# Patient Record
Sex: Female | Born: 1990 | Race: White | Hispanic: No | Marital: Married | Smoking: Never smoker
Health system: Southern US, Community
[De-identification: ages and names within clinical notes are randomized; demographics above are authoritative.]

## PROBLEM LIST (undated history)

## (undated) ENCOUNTER — Inpatient Hospital Stay (HOSPITAL_COMMUNITY): Payer: Self-pay

## (undated) DIAGNOSIS — J45909 Unspecified asthma, uncomplicated: Secondary | ICD-10-CM

## (undated) DIAGNOSIS — L732 Hidradenitis suppurativa: Secondary | ICD-10-CM

---

## 2009-09-02 HISTORY — PX: CHOLECYSTECTOMY: SHX55

## 2013-08-05 LAB — OB RESULTS CONSOLE ABO/RH: RH TYPE: POSITIVE

## 2013-08-05 LAB — OB RESULTS CONSOLE GC/CHLAMYDIA
Chlamydia: NEGATIVE
Gonorrhea: NEGATIVE

## 2013-08-05 LAB — OB RESULTS CONSOLE ANTIBODY SCREEN: ANTIBODY SCREEN: NEGATIVE

## 2013-08-05 LAB — OB RESULTS CONSOLE RPR: RPR: NONREACTIVE

## 2013-08-05 LAB — OB RESULTS CONSOLE HIV ANTIBODY (ROUTINE TESTING): HIV: NONREACTIVE

## 2013-08-05 LAB — OB RESULTS CONSOLE RUBELLA ANTIBODY, IGM: Rubella: IMMUNE

## 2013-08-05 LAB — OB RESULTS CONSOLE HEPATITIS B SURFACE ANTIGEN: Hepatitis B Surface Ag: NEGATIVE

## 2013-08-06 LAB — OB RESULTS CONSOLE ABO/RH: RH Type: POSITIVE

## 2013-08-06 LAB — OB RESULTS CONSOLE GC/CHLAMYDIA
CHLAMYDIA, DNA PROBE: NEGATIVE
GC PROBE AMP, GENITAL: NEGATIVE

## 2013-08-06 LAB — OB RESULTS CONSOLE HEPATITIS B SURFACE ANTIGEN: Hepatitis B Surface Ag: NEGATIVE

## 2013-08-06 LAB — OB RESULTS CONSOLE HIV ANTIBODY (ROUTINE TESTING): HIV: NONREACTIVE

## 2013-08-06 LAB — OB RESULTS CONSOLE RUBELLA ANTIBODY, IGM: RUBELLA: IMMUNE

## 2013-08-06 LAB — OB RESULTS CONSOLE ANTIBODY SCREEN: Antibody Screen: NEGATIVE

## 2013-08-06 LAB — OB RESULTS CONSOLE RPR: RPR: NONREACTIVE

## 2013-09-02 NOTE — L&D Delivery Note (Signed)
Patient was C/C/+1 and pushed for 7 minutes with epidural.   NSVD  female infant, Apgars 9,9, weight P.   The patient had one midline first degree laceration repaired with 3-0 vicryl R. Fundus was firm. EBL was expected. Placenta was delivered intact. Vagina was clear.  Baby was vigorous and doing skin to skin with mother.  Felicia [name]

## 2014-01-24 ENCOUNTER — Encounter (HOSPITAL_COMMUNITY): Payer: Self-pay | Admitting: *Deleted

## 2014-01-24 ENCOUNTER — Inpatient Hospital Stay (HOSPITAL_COMMUNITY)
Admission: AD | Admit: 2014-01-24 | Discharge: 2014-01-24 | Disposition: A | Discharge status: Home / Self Care | Payer: 59 | Source: Ambulatory Visit | Attending: Obstetrics and Gynecology | Admitting: Obstetrics and Gynecology

## 2014-01-24 DIAGNOSIS — O99891 Other specified diseases and conditions complicating pregnancy: Secondary | ICD-10-CM | POA: Insufficient documentation

## 2014-01-24 DIAGNOSIS — O26899 Other specified pregnancy related conditions, unspecified trimester: Secondary | ICD-10-CM

## 2014-01-24 DIAGNOSIS — N949 Unspecified condition associated with female genital organs and menstrual cycle: Secondary | ICD-10-CM | POA: Insufficient documentation

## 2014-01-24 DIAGNOSIS — Z87891 Personal history of nicotine dependence: Secondary | ICD-10-CM | POA: Insufficient documentation

## 2014-01-24 DIAGNOSIS — R102 Pelvic and perineal pain: Secondary | ICD-10-CM

## 2014-01-24 DIAGNOSIS — O9989 Other specified diseases and conditions complicating pregnancy, childbirth and the puerperium: Principal | ICD-10-CM

## 2014-01-24 DIAGNOSIS — O26893 Other specified pregnancy related conditions, third trimester: Secondary | ICD-10-CM

## 2014-01-24 DIAGNOSIS — N898 Other specified noninflammatory disorders of vagina: Secondary | ICD-10-CM | POA: Insufficient documentation

## 2014-01-24 LAB — URINE MICROSCOPIC-ADD ON

## 2014-01-24 LAB — URINALYSIS, ROUTINE W REFLEX MICROSCOPIC
Bilirubin Urine: NEGATIVE
Glucose, UA: NEGATIVE mg/dL
Hgb urine dipstick: NEGATIVE
Ketones, ur: NEGATIVE mg/dL
Nitrite: NEGATIVE
Protein, ur: NEGATIVE mg/dL
Specific Gravity, Urine: 1.01 (ref 1.005–1.030)
Urobilinogen, UA: 0.2 mg/dL (ref 0.0–1.0)
pH: 7 (ref 5.0–8.0)

## 2014-01-24 LAB — AMNISURE RUPTURE OF MEMBRANE (ROM) NOT AT ARMC: Amnisure ROM: NEGATIVE

## 2014-01-24 NOTE — MAU Provider Note (Signed)
History     CSN: [id number]  Arrival date and time: 01/24/14 1056   First Provider Initiated Contact with Patient 01/24/14 1209      Chief Complaint  Patient presents with  . Vaginal pain, leaking    HPI  [name] is a 23 y.o. female [name] at [redacted]w[redacted]d who presents with vaginal pain/ pressure and leaking of fluid. Patient states the pressure started 3 days ago and the leaking of fluid started this morning. The fluid was clear and non-odorous. Denies problems in this pregnancy; denies HTN or DM.  Denies bleeding, baby is moving well.  Last intercourse was < 24 hours ago.  OB History   Grav Para Term Preterm Abortions TAB SAB Ect Mult Living   2 1 1       1       No past medical history on file.  No past surgical history on file.  No family history on file.  History  Substance Use Topics  . Smoking status: Not on file  . Smokeless tobacco: Not on file  . Alcohol Use: Not on file    Allergies: No Known Allergies  Prescriptions prior to admission  Medication Sig Dispense Refill  . Prenatal Vit-Fe Fumarate-FA (PRENATAL MULTIVITAMIN) TABS tablet Take 1 tablet by mouth daily at 12 noon.       Results for orders placed during the hospital encounter of 01/24/14 (from the past 48 hour(s))  URINALYSIS, ROUTINE W REFLEX MICROSCOPIC     Status: Abnormal   Collection Time    01/24/14 11:25 AM      Result Value Ref Range   Color, Urine YELLOW  YELLOW   APPearance CLEAR  CLEAR   Specific Gravity, Urine 1.010  1.005 - 1.030   pH 7.0  5.0 - 8.0   Glucose, UA NEGATIVE  NEGATIVE mg/dL   Hgb urine dipstick NEGATIVE  NEGATIVE   Bilirubin Urine NEGATIVE  NEGATIVE   Ketones, ur NEGATIVE  NEGATIVE mg/dL   Protein, ur NEGATIVE  NEGATIVE mg/dL   Urobilinogen, UA 0.2  0.0 - 1.0 mg/dL   Nitrite NEGATIVE  NEGATIVE   Leukocytes, UA SMALL (*) NEGATIVE  URINE MICROSCOPIC-ADD ON     Status: Abnormal   Collection Time    01/24/14 11:25 AM      Result Value Ref Range   Squamous  Epithelial / LPF FEW (*) RARE   WBC, UA 3-6  <3 WBC/hpf   RBC / HPF 0-2  <3 RBC/hpf   Bacteria, UA RARE  RARE  AMNISURE RUPTURE OF MEMBRANE (ROM)     Status: None   Collection Time    01/24/14 12:20 PM      Result Value Ref Range   Amnisure ROM NEGATIVE      Review of Systems  Constitutional: Negative for fever and chills.  Gastrointestinal: Negative for nausea, vomiting, abdominal pain, diarrhea and constipation.  Genitourinary: Negative for dysuria, urgency, frequency and hematuria.       + vaginal discharge: clear leaking of fluid.  No vaginal bleeding. No dysuria.    Physical Exam   Temperature 98.3 F (36.8 C), height 5\' 8"  (1.727 m), weight 129.275 kg (285 lb).  Physical Exam  Constitutional: She is oriented to person, place, and time. She appears well-developed and well-nourished. No distress.  HENT:  Head: Normocephalic.  Eyes: Pupils are equal, round, and reactive to light.  Neck: Neck supple.  Respiratory: Effort normal.  GI: Soft. She exhibits no distension. There is no tenderness.  Genitourinary:  Speculum exam: Vagina - Small amount of creamy, mucus like discharge, no odor. No pooling of fluid  Cervix - No contact bleeding,no active bleeding  Bimanual exam: Cervix FT, posterior  Amnisure collected  Chaperone present for exam.   Musculoskeletal: Normal range of motion.  Neurological: She is alert and oriented to person, place, and time.  Skin: Skin is warm. She is not diaphoretic.  Psychiatric: Her behavior is normal.   Fetal Tracing: Baseline: 145 bpm  Variability: Moderate  Accelerations: 15x15 Decelerations: none Toco: No contractions  MAU Course  Procedures None  MDM UA Amnisure- negative  Discussed exam and amnisure results with Dr. Claiborne Billingsallahan; ok to discharge patient home.   Assessment and Plan   A:  1. Vaginal discharge in pregnancy in third trimester   2. Pelvic pressure in pregnancy    P:  Discharge home in stable  condition Keep your follow up appointment as scheduled Return to MAU if symptoms worsen  Pregnancy support belt recommended   Iona HansenJennifer Irene Rasch, NP  01/24/2014, 3:50 PM

## 2014-01-24 NOTE — Discharge Instructions (Signed)
Pelvic Pain, Female °Female pelvic pain can be caused by many different things and start from a variety of places. Pelvic pain refers to pain that is located in the lower half of the abdomen and between your hips. The pain may occur over a short period of time (acute) or may be reoccurring (chronic). The cause of pelvic pain may be related to disorders affecting the female reproductive organs (gynecologic), but it may also be related to the bladder, kidney stones, an intestinal complication, or muscle or skeletal problems. Getting help right away for pelvic pain is important, especially if there has been severe, sharp, or a sudden onset of unusual pain. It is also important to get help right away because some types of pelvic pain can be life threatening.  °CAUSES  °Below are only some of the causes of pelvic pain. The causes of pelvic pain can be in one of several categories.  °· Gynecologic. °· Pelvic inflammatory disease. °· Sexually transmitted infection. °· Ovarian cyst or a twisted ovarian ligament (ovarian torsion). °· Uterine lining that grows outside the uterus (endometriosis). °· Fibroids, cysts, or tumors. °· Ovulation. °· Pregnancy. °· Pregnancy that occurs outside the uterus (ectopic pregnancy). °· Miscarriage. °· Labor. °· Abruption of the placenta or ruptured uterus. °· Infection. °· Uterine infection (endometritis). °· Bladder infection. °· Diverticulitis. °· Miscarriage related to a uterine infection (septic abortion). °· Bladder. °· Inflammation of the bladder (cystitis). °· Kidney stone(s). °· Gastrointenstinal. °· Constipation. °· Diverticulitis. °· Neurologic. °· Trauma. °· Feeling pelvic pain because of mental or emotional causes (psychosomatic). °· Cancers of the bowel or pelvis. °EVALUATION  °Your caregiver will want to take a careful history of your concerns. This includes recent changes in your health, a careful gynecologic history of your periods (menses), and a sexual history. Obtaining  your family history and medical history is also important. Your caregiver may suggest a pelvic exam. A pelvic exam will help identify the location and severity of the pain. It also helps in the evaluation of which organ system may be involved. In order to identify the cause of the pelvic pain and be properly treated, your caregiver may order tests. These tests may include:  °· A pregnancy test. °· Pelvic ultrasonography. °· An X-ray exam of the abdomen. °· A urinalysis or evaluation of vaginal discharge. °· Blood tests. °HOME CARE INSTRUCTIONS  °· Only take over-the-counter or prescription medicines for pain, discomfort, or fever as directed by your caregiver.   °· Rest as directed by your caregiver.   °· Eat a balanced diet.   °· Drink enough fluids to make your urine clear or pale yellow, or as directed.   °· Avoid sexual intercourse if it causes pain.   °· Apply warm or cold compresses to the lower abdomen depending on which one helps the pain.   °· Avoid stressful situations.   °· Keep a journal of your pelvic pain. Write down when it started, where the pain is located, and if there are things that seem to be associated with the pain, such as food or your menstrual cycle. °· Follow up with your caregiver as directed.   °SEEK MEDICAL CARE IF: °· Your medicine does not help your pain. °· You have abnormal vaginal discharge. °SEEK IMMEDIATE MEDICAL CARE IF:  °· You have heavy bleeding from the vagina.   °· Your pelvic pain increases.   °· You feel lightheaded or faint.   °· You have chills.   °· You have pain with urination or blood in your urine.   °· You have uncontrolled   diarrhea or vomiting.   °· You have a fever or persistent symptoms for more than 3 days. °· You have a fever and your symptoms suddenly get worse.   °· You are being physically or sexually abused.   °MAKE SURE YOU: °· Understand these instructions. °· Will watch your condition. °· Will get help if you are not doing well or get worse. °Document  Released: 07/16/2004 Document Revised: 02/18/2012 Document Reviewed: 12/09/2011 °[employer]® Patient Information ©2014 [employer]. ° °

## 2014-02-23 LAB — OB RESULTS CONSOLE GBS: GBS: NEGATIVE

## 2014-03-22 ENCOUNTER — Encounter (HOSPITAL_COMMUNITY): Payer: Self-pay | Admitting: *Deleted

## 2014-03-22 ENCOUNTER — Telehealth (HOSPITAL_COMMUNITY): Payer: Self-pay | Admitting: *Deleted

## 2014-03-22 NOTE — Telephone Encounter (Signed)
Preadmission screen  

## 2014-03-24 ENCOUNTER — Encounter (HOSPITAL_COMMUNITY): Payer: 59 | Admitting: Anesthesiology

## 2014-03-24 ENCOUNTER — Inpatient Hospital Stay (HOSPITAL_COMMUNITY): Payer: 59 | Admitting: Anesthesiology

## 2014-03-24 ENCOUNTER — Inpatient Hospital Stay (HOSPITAL_COMMUNITY)
Admission: RE | Admit: 2014-03-24 | Discharge: 2014-03-26 | DRG: 775 | Disposition: A | Discharge status: Home / Self Care | Payer: 59 | Source: Ambulatory Visit | Attending: Obstetrics and Gynecology | Admitting: Obstetrics and Gynecology

## 2014-03-24 ENCOUNTER — Encounter (HOSPITAL_COMMUNITY): Payer: Self-pay

## 2014-03-24 DIAGNOSIS — Z3490 Encounter for supervision of normal pregnancy, unspecified, unspecified trimester: Secondary | ICD-10-CM

## 2014-03-24 DIAGNOSIS — Z6841 Body Mass Index (BMI) 40.0 and over, adult: Secondary | ICD-10-CM

## 2014-03-24 DIAGNOSIS — E669 Obesity, unspecified: Secondary | ICD-10-CM | POA: Diagnosis present

## 2014-03-24 DIAGNOSIS — O99214 Obesity complicating childbirth: Secondary | ICD-10-CM

## 2014-03-24 DIAGNOSIS — Z9089 Acquired absence of other organs: Secondary | ICD-10-CM

## 2014-03-24 LAB — CBC
HEMATOCRIT: 33.4 % — AB (ref 36.0–46.0)
HEMOGLOBIN: 11.2 g/dL — AB (ref 12.0–15.0)
MCH: 29.4 pg (ref 26.0–34.0)
MCHC: 33.5 g/dL (ref 30.0–36.0)
MCV: 87.7 fL (ref 78.0–100.0)
Platelets: 196 10*3/uL (ref 150–400)
RBC: 3.81 MIL/uL — AB (ref 3.87–5.11)
RDW: 13.9 % (ref 11.5–15.5)
WBC: 12.8 10*3/uL — AB (ref 4.0–10.5)

## 2014-03-24 LAB — TYPE AND SCREEN
ABO/RH(D): A POS
Antibody Screen: NEGATIVE

## 2014-03-24 LAB — ABO/RH: ABO/RH(D): A POS

## 2014-03-24 LAB — SAMPLE TO BLOOD BANK

## 2014-03-24 LAB — RPR

## 2014-03-24 MED ORDER — PHENYLEPHRINE 40 MCG/ML (10ML) SYRINGE FOR IV PUSH (FOR BLOOD PRESSURE SUPPORT)
80.0000 ug | PREFILLED_SYRINGE | INTRAVENOUS | Status: DC | PRN
Start: 2014-03-24 — End: 2014-03-24
  Filled 2014-03-24: qty 2

## 2014-03-24 MED ORDER — LANOLIN HYDROUS EX OINT: TOPICAL_OINTMENT | CUTANEOUS | Status: DC | PRN

## 2014-03-24 MED ORDER — METHYLERGONOVINE MALEATE 0.2 MG/ML IJ SOLN: 0.2000 mg | INTRAMUSCULAR | Status: DC | PRN

## 2014-03-24 MED ORDER — LACTATED RINGERS IV SOLN: 500.0000 mL | INTRAVENOUS | Status: DC | PRN

## 2014-03-24 MED ORDER — OXYTOCIN 40 UNITS IN LACTATED RINGERS INFUSION - SIMPLE MED
1.0000 m[IU]/min | INTRAVENOUS | Status: DC
  Administered 2014-03-24: 2 m[IU]/min via INTRAVENOUS

## 2014-03-24 MED ORDER — IBUPROFEN 800 MG PO TABS
800.0000 mg | ORAL_TABLET | Freq: Three times a day (TID) | ORAL | Status: DC
  Administered 2014-03-24 – 2014-03-26 (×4): 800 mg via ORAL
  Filled 2014-03-24 (×4): qty 1

## 2014-03-24 MED ORDER — FERROUS SULFATE 325 (65 FE) MG PO TABS
325.0000 mg | ORAL_TABLET | Freq: Two times a day (BID) | ORAL | Status: DC
  Administered 2014-03-25 – 2014-03-26 (×3): 325 mg via ORAL
  Filled 2014-03-24 (×3): qty 1

## 2014-03-24 MED ORDER — ONDANSETRON HCL 4 MG/2ML IJ SOLN: 4.0000 mg | INTRAMUSCULAR | Status: DC | PRN

## 2014-03-24 MED ORDER — DIBUCAINE 1 % RE OINT: 1.0000 "application " | TOPICAL_OINTMENT | RECTAL | Status: DC | PRN

## 2014-03-24 MED ORDER — FENTANYL 2.5 MCG/ML BUPIVACAINE 1/10 % EPIDURAL INFUSION (WH - ANES)
14.0000 mL/h | INTRAMUSCULAR | Status: DC | PRN
  Administered 2014-03-24 (×2): 14 mL/h via EPIDURAL

## 2014-03-24 MED ORDER — DIPHENHYDRAMINE HCL 25 MG PO CAPS: 25.0000 mg | ORAL_CAPSULE | Freq: Four times a day (QID) | ORAL | Status: DC | PRN

## 2014-03-24 MED ORDER — OXYCODONE-ACETAMINOPHEN 5-325 MG PO TABS: 1.0000 | ORAL_TABLET | ORAL | Status: DC | PRN

## 2014-03-24 MED ORDER — ONDANSETRON HCL 4 MG PO TABS: 4.0000 mg | ORAL_TABLET | ORAL | Status: DC | PRN

## 2014-03-24 MED ORDER — LACTATED RINGERS IV SOLN
500.0000 mL | Freq: Once | INTRAVENOUS | Status: AC
  Administered 2014-03-24: 500 mL via INTRAVENOUS

## 2014-03-24 MED ORDER — OXYTOCIN BOLUS FROM INFUSION
500.0000 mL | INTRAVENOUS | Status: DC
  Administered 2014-03-24: 500 mL via INTRAVENOUS

## 2014-03-24 MED ORDER — DIPHENHYDRAMINE HCL 50 MG/ML IJ SOLN: 12.5000 mg | INTRAMUSCULAR | Status: DC | PRN

## 2014-03-24 MED ORDER — LIDOCAINE HCL (PF) 1 % IJ SOLN
INTRAMUSCULAR | Status: DC | PRN
  Administered 2014-03-24: 10 mL

## 2014-03-24 MED ORDER — TETANUS-DIPHTH-ACELL PERTUSSIS 5-2.5-18.5 LF-MCG/0.5 IM SUSP: 0.5000 mL | Freq: Once | INTRAMUSCULAR | Status: DC

## 2014-03-24 MED ORDER — PHENYLEPHRINE 40 MCG/ML (10ML) SYRINGE FOR IV PUSH (FOR BLOOD PRESSURE SUPPORT)
80.0000 ug | PREFILLED_SYRINGE | INTRAVENOUS | Status: DC | PRN
  Filled 2014-03-24: qty 2
  Filled 2014-03-24: qty 10

## 2014-03-24 MED ORDER — IBUPROFEN 600 MG PO TABS
600.0000 mg | ORAL_TABLET | Freq: Four times a day (QID) | ORAL | Status: DC | PRN
  Administered 2014-03-24: 600 mg via ORAL
  Filled 2014-03-24: qty 1

## 2014-03-24 MED ORDER — MEASLES, MUMPS & RUBELLA VAC ~~LOC~~ INJ: 0.5000 mL | INJECTION | Freq: Once | SUBCUTANEOUS | Status: DC

## 2014-03-24 MED ORDER — OXYTOCIN 10 UNIT/ML IJ SOLN
40.0000 [IU] | INTRAVENOUS | Status: DC
  Filled 2014-03-24: qty 4

## 2014-03-24 MED ORDER — TERBUTALINE SULFATE 1 MG/ML IJ SOLN: 0.2500 mg | Freq: Once | INTRAMUSCULAR | Status: DC | PRN

## 2014-03-24 MED ORDER — ZOLPIDEM TARTRATE 5 MG PO TABS: 5.0000 mg | ORAL_TABLET | Freq: Every evening | ORAL | Status: DC | PRN

## 2014-03-24 MED ORDER — CITRIC ACID-SODIUM CITRATE 334-500 MG/5ML PO SOLN: 30.0000 mL | ORAL | Status: DC | PRN

## 2014-03-24 MED ORDER — SENNOSIDES-DOCUSATE SODIUM 8.6-50 MG PO TABS
2.0000 | ORAL_TABLET | ORAL | Status: DC
  Administered 2014-03-24 – 2014-03-25 (×2): 2 via ORAL
  Filled 2014-03-24 (×2): qty 2

## 2014-03-24 MED ORDER — BENZOCAINE-MENTHOL 20-0.5 % EX AERO: 1.0000 "application " | INHALATION_SPRAY | CUTANEOUS | Status: DC | PRN

## 2014-03-24 MED ORDER — LIDOCAINE HCL (PF) 1 % IJ SOLN
30.0000 mL | INTRAMUSCULAR | Status: DC | PRN
  Filled 2014-03-24: qty 30

## 2014-03-24 MED ORDER — FLEET ENEMA 7-19 GM/118ML RE ENEM: 1.0000 | ENEMA | RECTAL | Status: DC | PRN

## 2014-03-24 MED ORDER — SODIUM CHLORIDE 0.9 % IJ SOLN: 3.0000 mL | INTRAMUSCULAR | Status: DC | PRN

## 2014-03-24 MED ORDER — EPHEDRINE 5 MG/ML INJ
10.0000 mg | INTRAVENOUS | Status: DC | PRN
  Filled 2014-03-24: qty 2

## 2014-03-24 MED ORDER — MAGNESIUM HYDROXIDE 400 MG/5ML PO SUSP: 30.0000 mL | ORAL | Status: DC | PRN

## 2014-03-24 MED ORDER — OXYTOCIN 40 UNITS IN LACTATED RINGERS INFUSION - SIMPLE MED
62.5000 mL/h | INTRAVENOUS | Status: DC
  Administered 2014-03-24: 62.5 mL/h via INTRAVENOUS
  Filled 2014-03-24 (×2): qty 1000

## 2014-03-24 MED ORDER — PRENATAL MULTIVITAMIN CH
1.0000 | ORAL_TABLET | Freq: Every day | ORAL | Status: DC
  Administered 2014-03-25: 1 via ORAL
  Filled 2014-03-24: qty 1

## 2014-03-24 MED ORDER — ACETAMINOPHEN 325 MG PO TABS: 650.0000 mg | ORAL_TABLET | ORAL | Status: DC | PRN

## 2014-03-24 MED ORDER — LACTATED RINGERS IV SOLN
INTRAVENOUS | Status: DC
  Administered 2014-03-24 (×2): via INTRAVENOUS

## 2014-03-24 MED ORDER — EPHEDRINE 5 MG/ML INJ
10.0000 mg | INTRAVENOUS | Status: DC | PRN
  Filled 2014-03-24: qty 4
  Filled 2014-03-24: qty 2

## 2014-03-24 MED ORDER — METHYLERGONOVINE MALEATE 0.2 MG PO TABS
0.2000 mg | ORAL_TABLET | ORAL | Status: DC | PRN
  Administered 2014-03-24 (×2): 0.2 mg via ORAL
  Filled 2014-03-24 (×2): qty 1

## 2014-03-24 MED ORDER — ONDANSETRON HCL 4 MG/2ML IJ SOLN: 4.0000 mg | Freq: Four times a day (QID) | INTRAMUSCULAR | Status: DC | PRN

## 2014-03-24 MED ORDER — WITCH HAZEL-GLYCERIN EX PADS: 1.0000 "application " | MEDICATED_PAD | CUTANEOUS | Status: DC | PRN

## 2014-03-24 MED ORDER — SODIUM CHLORIDE 0.9 % IV SOLN: 250.0000 mL | INTRAVENOUS | Status: DC | PRN

## 2014-03-24 MED ORDER — SIMETHICONE 80 MG PO CHEW: 80.0000 mg | CHEWABLE_TABLET | ORAL | Status: DC | PRN

## 2014-03-24 MED ORDER — SODIUM CHLORIDE 0.9 % IJ SOLN: 3.0000 mL | Freq: Two times a day (BID) | INTRAMUSCULAR | Status: DC

## 2014-03-24 MED ORDER — FENTANYL 2.5 MCG/ML BUPIVACAINE 1/10 % EPIDURAL INFUSION (WH - ANES)
14.0000 mL/h | INTRAMUSCULAR | Status: DC | PRN
  Filled 2014-03-24 (×2): qty 125

## 2014-03-24 NOTE — Anesthesia Procedure Notes (Signed)
Epidural Patient location during procedure: OB  Preanesthetic Checklist Completed: patient identified, site marked, surgical consent, pre-op evaluation, timeout performed, IV checked, risks and benefits discussed and monitors and equipment checked  Epidural Patient position: sitting Prep: site prepped and draped and DuraPrep Patient monitoring: continuous pulse ox and blood pressure Approach: midline Injection technique: LOR air  Needle:  Needle type: Tuohy  Needle gauge: 17 G Needle length: 9 cm and 9 Needle insertion depth: 7 cm Catheter type: closed end flexible Catheter size: 19 Gauge Catheter at skin depth: 14 cm Test dose: negative  Assessment Events: blood not aspirated, injection not painful, no injection resistance, negative IV test and no paresthesia  Additional Notes Dosing of Epidural:  1st dose, through catheter .............................................  Xylocaine 40 mg  2nd dose, through catheter, after waiting 3 minutes.........Xylocaine 60 mg    ( 1% Xylo charted as a single dose in Epic Meds for ease of charting; actual dosing was fractionated as above, for saftey's sake)  As each dose occurred, patient was free of IV sx; and patient exhibited no evidence of SA injection.  Patient is more comfortable after epidural dosed. Please see RN's note for documentation of vital signs,and FHR which are stable.  Patient reminded not to try to ambulate with numb legs, and that an RN must be present when she attempts to get up.       

## 2014-03-24 NOTE — Progress Notes (Signed)
23 y.o. 4772w3d  [name] comes in for elective induction at term.  Otherwise has good fetal movement and no bleeding.  No past medical history on file.  Past Surgical History  Procedure Laterality Date  . Cholecystectomy  2011    OB History  Gravida Para Term Preterm AB SAB TAB Ectopic Multiple Living  2 1 1       1     # Outcome Date GA Lbr Len/2nd Weight Sex Delivery Anes PTL Lv  2 CUR           1 TRM 2013 4484w0d 03:00 4.082 kg (9 lb)  SVD EPI N Y      History   Social History  . Marital Status: Married    Spouse Name: N/A    Number of Children: N/A  . Years of Education: N/A   Occupational History  . Not on file.   Social History Main Topics  . Smoking status: Not on file  . Smokeless tobacco: Not on file  . Alcohol Use: No  . Drug Use: Not on file  . Sexual Activity: Yes    Birth Control/ Protection: None   Other Topics Concern  . Not on file   Social History Narrative  . No narrative on file   Review of patient's allergies indicates no known allergies.    Prenatal Transfer Tool  Maternal Diabetes: No Genetic Screening: Declined Maternal Ultrasounds/Referrals: Normal Fetal Ultrasounds or other Referrals:  None Maternal Substance Abuse:  No Significant Maternal Medications:  None Significant Maternal Lab Results: None  Other PNC: uncomplicated.    Filed Vitals:   03/24/14 0740  BP: 125/68  Pulse: 100  Temp: 97.7 F (36.5 C)  Resp: 20     Lungs/Cor:  NAD Abdomen:  soft, gravid Ex:  no cords, erythema SVE:  3/30/-2, AROM clear FHTs:  130, good STV, NST R Toco:  q occ   A/P   Term elective induction.  GBS NEG.  Aragorn Recker A

## 2014-03-24 NOTE — Anesthesia Preprocedure Evaluation (Addendum)
Anesthesia Evaluation  Patient identified by MRN, date of birth, ID band Patient awake    Reviewed: Allergy & Precautions, H&P , Patient's Chart, lab work & pertinent test results  Airway Mallampati: III  TM Distance: >3 FB Neck ROM: full    Dental   Pulmonary  breath sounds clear to auscultation        Cardiovascular Rhythm:regular Rate:Normal     Neuro/Psych    GI/Hepatic   Endo/Other  Morbid obesity  Renal/GU      Musculoskeletal   Abdominal   Peds  Hematology   Anesthesia Other Findings   Reproductive/Obstetrics (+) Pregnancy                             Anesthesia Physical Anesthesia Plan  ASA: III  Anesthesia Plan: Epidural   Post-op Pain Management:    Induction:   Airway Management Planned:   Additional Equipment:   Intra-op Plan:   Post-operative Plan:   Informed Consent: I have reviewed the patients History and Physical, chart, labs and discussed the procedure including the risks, benefits and alternatives for the proposed anesthesia with the patient or authorized representative who has indicated his/her understanding and acceptance.     Plan Discussed with:   Anesthesia Plan Comments:         Anesthesia Quick Evaluation  

## 2014-03-25 ENCOUNTER — Encounter (HOSPITAL_COMMUNITY): Payer: Self-pay

## 2014-03-25 LAB — CBC
HCT: 34.7 % — ABNORMAL LOW (ref 36.0–46.0)
HEMOGLOBIN: 11.3 g/dL — AB (ref 12.0–15.0)
MCH: 28.7 pg (ref 26.0–34.0)
MCHC: 32.6 g/dL (ref 30.0–36.0)
MCV: 88.1 fL (ref 78.0–100.0)
Platelets: 187 10*3/uL (ref 150–400)
RBC: 3.94 MIL/uL (ref 3.87–5.11)
RDW: 13.8 % (ref 11.5–15.5)
WBC: 13.6 10*3/uL — ABNORMAL HIGH (ref 4.0–10.5)

## 2014-03-25 NOTE — Anesthesia Postprocedure Evaluation (Signed)
Anesthesia Post Note  Patient: Hospital doctorAmber L [name]  Procedure(s) Performed: * No procedures listed *  Anesthesia type: Epidural  Patient location: Mother/Baby  Post pain: Pain level controlled  Post assessment: Post-op Vital signs reviewed  Last Vitals:  Filed Vitals:   03/25/14 0604  BP: 105/62  Pulse: 75  Temp: 36.6 C  Resp: 18    Post vital signs: Reviewed  Level of consciousness:alert  Complications: No apparent anesthesia complications

## 2014-03-25 NOTE — Progress Notes (Signed)
Patient is eating, ambulating, voiding.  Pain control is good.  Appropriate lochia.  No complaints.  Filed Vitals:   03/24/14 1832 03/24/14 2201 03/25/14 0604 03/25/14 1810  BP: 127/77 117/71 105/62 126/65  Pulse: 98 93 75 88  Temp: 98.5 F (36.9 C) 98 F (36.7 C) 97.8 F (36.6 C) 97.9 F (36.6 C)  TempSrc: Oral Oral Oral Oral  Resp: 18 18 18 18   Height:      Weight:      SpO2: 98% 99% 97%     Fundus firm Perineum without swelling.  Lab Results  Component Value Date   WBC 13.6* 03/25/2014   HGB 11.3* 03/25/2014   HCT 34.7* 03/25/2014   MCV 88.1 03/25/2014   PLT 187 03/25/2014    --/--/A POS, A POS (07/23 0740)  A/P Post partum day 1.  Routine care.   Circ in office. Expect d/c 7/25.    Philip AspenALLAHAN, Daine Gunther

## 2014-03-26 NOTE — Discharge Summary (Signed)
Obstetric Discharge Summary Reason for Admission: induction of labor Prenatal Procedures: ultrasound Intrapartum Procedures: spontaneous vaginal delivery Postpartum Procedures: vaginal pack for continued bleeding at repair, removed and stable Complications-Operative and Postpartum: 1st degree perineal laceration Hemoglobin  Date Value Ref Range Status  03/25/2014 11.3* 12.0 - 15.0 g/dL Final     HCT  Date Value Ref Range Status  03/25/2014 34.7* 36.0 - 46.0 % Final    Physical Exam:  General: alert and cooperative Lochia: appropriate Uterine Fundus: firm DVT Evaluation: No evidence of DVT seen on physical exam.  Discharge Diagnoses: Term Pregnancy-delivered  Discharge Information: Date: 03/26/2014 Activity: pelvic rest Diet: routine Medications: PNV and Ibuprofen Condition: stable Instructions: refer to practice specific booklet Discharge to: home Follow-up Information   Follow up with HORVATH,MICHELLE A, MD In 4 weeks.   Specialty:  Obstetrics and Gynecology   Contact information:   [address] [place] [id number] [phone]       Newborn Data: Live born female  Birth Weight: 9 lb 3 oz (4167 g) APGAR: 9, 9  Home with mother.  Philip AspenCALLAHAN, Abdoul Encinas 03/26/2014, 10:03 AM

## 2014-03-29 NOTE — H&P (Signed)
23 y.o. 3954w3d  [name] comes in for elective induction at term.  Otherwise has good fetal movement and no bleeding.  History reviewed. No pertinent past medical history.  Past Surgical History  Procedure Laterality Date  . Cholecystectomy  2011    OB History  Gravida Para Term Preterm AB SAB TAB Ectopic Multiple Living  2 2 2       2     # Outcome Date GA Lbr Len/2nd Weight Sex Delivery Anes PTL Lv  2 TRM 03/24/14 9854w3d 06:37 / 00:20 4.167 kg (9 lb 3 oz) M SVD EPI  Y  1 TRM 2013 3467w0d 03:00 4.082 kg (9 lb)  SVD EPI N Y      History   Social History  . Marital Status: Married    Spouse Name: Felicia [name]    Number of Children: Felicia [name]  . Years of Education: Felicia [name]   Occupational History  . Not on file.   Social History Main Topics  . Smoking status: Never Smoker   . Smokeless tobacco: Not on file  . Alcohol Use: No  . Drug Use: Not on file  . Sexual Activity: Yes    Birth Control/ Protection: None   Other Topics Concern  . Not on file   Social History Narrative  . No narrative on file   Review of patient's allergies indicates no known allergies.    Prenatal Transfer Tool  Maternal Diabetes: No Genetic Screening: Declined Maternal Ultrasounds/Referrals: Normal Fetal Ultrasounds or other Referrals:  None Maternal Substance Abuse:  No Significant Maternal Medications:  None Significant Maternal Lab Results: None  Other PNC: uncomplicated.    Filed Vitals:   03/26/14 0556  BP: 104/53  Pulse: 71  Temp: 98 F (36.7 C)  Resp: 18     Lungs/Cor:  NAD Abdomen:  soft, gravid Ex:  no cords, erythema SVE:  3/30/-2, AROM clear FHTs:  130, good STV, NST R Toco:  q occ   [name]/P   Term elective induction.  GBS NEG.  Felicia [name]

## 2014-07-04 ENCOUNTER — Encounter (HOSPITAL_COMMUNITY): Payer: Self-pay

## 2015-02-20 LAB — OB RESULTS CONSOLE GBS: STREP GROUP B AG: NEGATIVE

## 2015-03-03 ENCOUNTER — Other Ambulatory Visit: Payer: Self-pay | Admitting: Obstetrics and Gynecology

## 2015-03-03 ENCOUNTER — Encounter (HOSPITAL_COMMUNITY): Payer: Self-pay | Admitting: *Deleted

## 2015-03-04 NOTE — Anesthesia Preprocedure Evaluation (Addendum)
Anesthesia Evaluation  Patient identified by MRN, date of birth, ID band Patient awake    Reviewed: Allergy & Precautions, NPO status , Patient's Chart, lab work & pertinent test results, reviewed documented beta blocker date and time   Airway Mallampati: II   Neck ROM: Full    Dental  (+) Teeth Intact, Dental Advisory Given   Pulmonary neg pulmonary ROS,  breath sounds clear to auscultation        Cardiovascular negative cardio ROS  Rhythm:Regular     Neuro/Psych negative neurological ROS     GI/Hepatic negative GI ROS, Neg liver ROS,   Endo/Other  Morbid obesity  Renal/GU negative Renal ROS     Musculoskeletal   Abdominal (+)  Abdomen: soft.    Peds  Hematology   Anesthesia Other Findings   Reproductive/Obstetrics                            Anesthesia Physical Anesthesia Plan  ASA: II  Anesthesia Plan: MAC and General   Post-op Pain Management:    Induction: Intravenous  Airway Management Planned: LMA  Additional Equipment:   Intra-op Plan:   Post-operative Plan:   Informed Consent: I have reviewed the patients History and Physical, chart, labs and discussed the procedure including the risks, benefits and alternatives for the proposed anesthesia with the patient or authorized representative who has indicated his/her understanding and acceptance.     Plan Discussed with:   Anesthesia Plan Comments:        Anesthesia Quick Evaluation

## 2015-03-07 ENCOUNTER — Encounter (HOSPITAL_COMMUNITY)
Admission: RE | Disposition: A | Discharge status: Home / Self Care | Payer: Self-pay | Source: Ambulatory Visit | Attending: Obstetrics and Gynecology

## 2015-03-07 ENCOUNTER — Encounter (HOSPITAL_COMMUNITY): Payer: Self-pay

## 2015-03-07 ENCOUNTER — Ambulatory Visit (HOSPITAL_COMMUNITY): Payer: 59

## 2015-03-07 ENCOUNTER — Ambulatory Visit (HOSPITAL_COMMUNITY): Payer: 59 | Admitting: Anesthesiology

## 2015-03-07 ENCOUNTER — Ambulatory Visit (HOSPITAL_COMMUNITY)
Admission: RE | Admit: 2015-03-07 | Discharge: 2015-03-07 | Disposition: A | Discharge status: Home / Self Care | Payer: 59 | Source: Ambulatory Visit | Attending: Obstetrics and Gynecology | Admitting: Obstetrics and Gynecology

## 2015-03-07 DIAGNOSIS — O99211 Obesity complicating pregnancy, first trimester: Secondary | ICD-10-CM | POA: Diagnosis not present

## 2015-03-07 DIAGNOSIS — Z3A13 13 weeks gestation of pregnancy: Secondary | ICD-10-CM | POA: Diagnosis not present

## 2015-03-07 DIAGNOSIS — Z3689 Encounter for other specified antenatal screening: Secondary | ICD-10-CM

## 2015-03-07 DIAGNOSIS — O021 Missed abortion: Secondary | ICD-10-CM | POA: Insufficient documentation

## 2015-03-07 DIAGNOSIS — Z9049 Acquired absence of other specified parts of digestive tract: Secondary | ICD-10-CM | POA: Diagnosis not present

## 2015-03-07 DIAGNOSIS — Z6841 Body Mass Index (BMI) 40.0 and over, adult: Secondary | ICD-10-CM | POA: Diagnosis not present

## 2015-03-07 HISTORY — PX: DILATION AND EVACUATION: SHX1459

## 2015-03-07 LAB — TYPE AND SCREEN
ABO/RH(D): A POS
Antibody Screen: NEGATIVE

## 2015-03-07 LAB — CBC
HEMATOCRIT: 36.1 % (ref 36.0–46.0)
HEMOGLOBIN: 12.4 g/dL (ref 12.0–15.0)
MCH: 29.2 pg (ref 26.0–34.0)
MCHC: 34.3 g/dL (ref 30.0–36.0)
MCV: 84.9 fL (ref 78.0–100.0)
Platelets: 232 10*3/uL (ref 150–400)
RBC: 4.25 MIL/uL (ref 3.87–5.11)
RDW: 14.3 % (ref 11.5–15.5)
WBC: 8.6 10*3/uL (ref 4.0–10.5)

## 2015-03-07 SURGERY — DILATION AND EVACUATION, UTERUS
Anesthesia: Monitor Anesthesia Care | Site: Uterus

## 2015-03-07 MED ORDER — PROPOFOL 500 MG/50ML IV EMUL
INTRAVENOUS | Status: DC | PRN
  Administered 2015-03-07: 30 mg via INTRAVENOUS
  Administered 2015-03-07: 50 mg via INTRAVENOUS
  Administered 2015-03-07 (×2): 30 mg via INTRAVENOUS
  Administered 2015-03-07: 200 mg via INTRAVENOUS
  Administered 2015-03-07 (×2): 30 mg via INTRAVENOUS

## 2015-03-07 MED ORDER — SUCCINYLCHOLINE CHLORIDE 20 MG/ML IJ SOLN
INTRAMUSCULAR | Status: AC
  Filled 2015-03-07: qty 1

## 2015-03-07 MED ORDER — PROPOFOL 10 MG/ML IV BOLUS
INTRAVENOUS | Status: AC
  Filled 2015-03-07: qty 20

## 2015-03-07 MED ORDER — MIDAZOLAM HCL 2 MG/2ML IJ SOLN
INTRAMUSCULAR | Status: DC | PRN
  Administered 2015-03-07: 2 mg via INTRAVENOUS

## 2015-03-07 MED ORDER — PROPOFOL 10 MG/ML IV BOLUS
INTRAVENOUS | Status: AC
  Filled 2015-03-07: qty 40

## 2015-03-07 MED ORDER — DEXAMETHASONE SODIUM PHOSPHATE 4 MG/ML IJ SOLN
INTRAMUSCULAR | Status: AC
  Filled 2015-03-07: qty 1

## 2015-03-07 MED ORDER — KETOROLAC TROMETHAMINE 30 MG/ML IJ SOLN
INTRAMUSCULAR | Status: AC
  Filled 2015-03-07: qty 1

## 2015-03-07 MED ORDER — ONDANSETRON HCL 4 MG/2ML IJ SOLN
INTRAMUSCULAR | Status: AC
  Filled 2015-03-07: qty 2

## 2015-03-07 MED ORDER — MIDAZOLAM HCL 2 MG/2ML IJ SOLN
INTRAMUSCULAR | Status: AC
  Filled 2015-03-07: qty 2

## 2015-03-07 MED ORDER — FENTANYL CITRATE (PF) 100 MCG/2ML IJ SOLN
INTRAMUSCULAR | Status: AC
  Filled 2015-03-07: qty 2

## 2015-03-07 MED ORDER — SCOPOLAMINE 1 MG/3DAYS TD PT72
1.0000 | MEDICATED_PATCH | Freq: Once | TRANSDERMAL | Status: DC
  Administered 2015-03-07: 1.5 mg via TRANSDERMAL

## 2015-03-07 MED ORDER — LIDOCAINE HCL 1 % IJ SOLN
INTRAMUSCULAR | Status: DC | PRN
  Administered 2015-03-07: 20 mL

## 2015-03-07 MED ORDER — ONDANSETRON HCL 4 MG/2ML IJ SOLN
INTRAMUSCULAR | Status: DC | PRN
  Administered 2015-03-07: 4 mg via INTRAVENOUS

## 2015-03-07 MED ORDER — PROPOFOL INFUSION 10 MG/ML OPTIME: INTRAVENOUS | Status: DC | PRN

## 2015-03-07 MED ORDER — SCOPOLAMINE 1 MG/3DAYS TD PT72
MEDICATED_PATCH | TRANSDERMAL | Status: AC
  Filled 2015-03-07: qty 1

## 2015-03-07 MED ORDER — FENTANYL CITRATE (PF) 100 MCG/2ML IJ SOLN
INTRAMUSCULAR | Status: DC | PRN
  Administered 2015-03-07 (×2): 50 ug via INTRAVENOUS

## 2015-03-07 MED ORDER — LIDOCAINE HCL (CARDIAC) 20 MG/ML IV SOLN
INTRAVENOUS | Status: DC | PRN
  Administered 2015-03-07: 80 mg via INTRAVENOUS
  Administered 2015-03-07: 20 mg via INTRAVENOUS
  Administered 2015-03-07: 2 mg via INTRAVENOUS

## 2015-03-07 MED ORDER — DEXAMETHASONE SODIUM PHOSPHATE 10 MG/ML IJ SOLN
INTRAMUSCULAR | Status: DC | PRN
  Administered 2015-03-07: 4 mg via INTRAVENOUS

## 2015-03-07 MED ORDER — PROPOFOL INFUSION 10 MG/ML OPTIME
INTRAVENOUS | Status: DC | PRN
  Administered 2015-03-07: 120 ug/kg/min via INTRAVENOUS

## 2015-03-07 MED ORDER — CEFAZOLIN SODIUM-DEXTROSE 2-3 GM-% IV SOLR
INTRAVENOUS | Status: AC
  Administered 2015-03-07: 2 g via INTRAVENOUS
  Filled 2015-03-07: qty 50

## 2015-03-07 MED ORDER — LIDOCAINE HCL (CARDIAC) 20 MG/ML IV SOLN
INTRAVENOUS | Status: AC
  Filled 2015-03-07: qty 5

## 2015-03-07 MED ORDER — KETOROLAC TROMETHAMINE 30 MG/ML IJ SOLN
INTRAMUSCULAR | Status: DC | PRN
  Administered 2015-03-07: 30 mg via INTRAVENOUS

## 2015-03-07 MED ORDER — CEFAZOLIN SODIUM-DEXTROSE 2-3 GM-% IV SOLR: 2.0000 g | INTRAVENOUS | Status: DC

## 2015-03-07 MED ORDER — LACTATED RINGERS IV SOLN
INTRAVENOUS | Status: DC
  Administered 2015-03-07 (×2): via INTRAVENOUS

## 2015-03-07 MED ORDER — LIDOCAINE HCL 1 % IJ SOLN
INTRAMUSCULAR | Status: AC
  Filled 2015-03-07: qty 20

## 2015-03-07 SURGICAL SUPPLY — 17 items
CATH ROBINSON RED A/P 16FR (CATHETERS) IMPLANT
CLOTH BEACON ORANGE TIMEOUT ST (SAFETY) ×2 IMPLANT
DECANTER SPIKE VIAL GLASS SM (MISCELLANEOUS) ×2 IMPLANT
GLOVE ECLIPSE 7.0 STRL STRAW (GLOVE) ×4 IMPLANT
GOWN STRL REUS W/TWL LRG LVL3 (GOWN DISPOSABLE) ×6 IMPLANT
KIT BERKELEY 1ST TRIMESTER 3/8 (MISCELLANEOUS) ×2 IMPLANT
NS IRRIG 1000ML POUR BTL (IV SOLUTION) ×2 IMPLANT
PACK VAGINAL MINOR WOMEN LF (CUSTOM PROCEDURE TRAY) ×2 IMPLANT
PAD OB MATERNITY 4.3X12.25 (PERSONAL CARE ITEMS) ×2 IMPLANT
PAD PREP 24X48 CUFFED NSTRL (MISCELLANEOUS) ×2 IMPLANT
SET BERKELEY SUCTION TUBING (SUCTIONS) ×2 IMPLANT
TOWEL OR 17X24 6PK STRL BLUE (TOWEL DISPOSABLE) ×4 IMPLANT
VACURETTE 10 RIGID CVD (CANNULA) ×2 IMPLANT
VACURETTE 12 RIGID CVD (CANNULA) ×2 IMPLANT
VACURETTE 7MM CVD STRL WRAP (CANNULA) IMPLANT
VACURETTE 8 RIGID CVD (CANNULA) IMPLANT
VACURETTE 9 RIGID CVD (CANNULA) IMPLANT

## 2015-03-07 NOTE — OR Nursing (Signed)
Pt. Requested a comfirmation ultrasound before D&E procedure. Delay is due to that. Dr. Dareen PianoAnderson was called as we headed back to OR 8.

## 2015-03-07 NOTE — Transfer of Care (Signed)
Immediate Anesthesia Transfer of Care Note  Patient: Malvika L Goins  Procedure(s) Performed: Procedure(s): DILATATION AND EVACUATION (N/A)  Patient Location: PACU  Anesthesia Type:General  Level of Consciousness: awake, alert , oriented and patient cooperative  Airway & Oxygen Therapy: Patient Spontanous Breathing and Patient connected to nasal cannula oxygen  Post-op Assessment: Report given to RN and Post -op Vital signs reviewed and stable  Post vital signs: Reviewed and stable  Last Vitals:  BP 94/69 HR96 RR 18 TEMP 98.0 POX 99  Complications: No apparent anesthesia complications

## 2015-03-07 NOTE — Discharge Instructions (Signed)

## 2015-03-07 NOTE — Anesthesia Procedure Notes (Signed)
Procedure Name: Intubation Date/Time: 03/07/2015 2:09 PM Performed by: Collier FlowersSPEIGHT, Shelina Luo J Pre-anesthesia Checklist: Patient identified, Emergency Drugs available, Suction available, Patient being monitored and Timeout performed Patient Re-evaluated:Patient Re-evaluated prior to inductionOxygen Delivery Method: Circle system utilized Preoxygenation: Pre-oxygenation with 100% oxygen Intubation Type: IV induction and Rapid sequence Laryngoscope Size: Miller and 3 Tube type: Oral Tube size: 7.0 mm Airway Equipment and Method: Stylet Placement Confirmation: ETT inserted through vocal cords under direct vision,  positive ETCO2 and breath sounds checked- equal and bilateral Secured at: 21 (At lips) cm Tube secured with: Tape Dental Injury: Teeth and Oropharynx as per pre-operative assessment

## 2015-03-07 NOTE — H&P (Signed)
Felicia [name] is an 24 y.o. G3P2002 white female who presents to the OR for a D&E for a  SAB. Pt did not have genetic testing.   Past Surgical History  Procedure Laterality Date  . Cholecystectomy  2011    History reviewed. No pertinent family history. Social History:  reports that she has never smoked. She does not have any smokeless tobacco history on file. She reports that she does not drink alcohol. Her drug history is not on file.  Allergies: No Known Allergies  Medications Prior to Admission  Medication Sig Dispense Refill  . acetaminophen (TYLENOL) 500 MG tablet Take 500 mg by mouth daily as needed for mild pain or moderate pain.    . Prenatal Vit-Fe Fumarate-FA (PRENATAL MULTIVITAMIN) TABS tablet Take 1 tablet by mouth daily at 12 noon.         Pulse 90, temperature 98.6 F (37 C), temperature source Oral, resp. rate 16, height 5\' 8"  (1.727 m), weight 282 lb (127.914 kg), SpO2 98 %, unknown if currently breastfeeding. General appearance: alert Lungs: clear to auscultation bilaterally Heart: regular rate and rhythm, S1, S2 normal, no murmur, click, rub or gallop Abdomen: soft non tender, no masses     Patient Active Problem List   Diagnosis Date Noted  . Pregnant 03/24/2014  . Postpartum state 03/24/2014   IMP/SAB Plan/ Proceed with D&E ANDERSON,MARK E 03/07/2015, 11:48 AM

## 2015-03-07 NOTE — Anesthesia Postprocedure Evaluation (Signed)
  Anesthesia Post-op Note  Patient: Hospital doctorAmber L [name]  Procedure(s) Performed: Procedure(s): DILATATION AND EVACUATION (N/A)  Patient Location: PACU  Anesthesia Type:General  Level of Consciousness: awake, alert  and oriented  Airway and Oxygen Therapy: Patient Spontanous Breathing  Post-op Pain: none  Post-op Assessment: Post-op Vital signs reviewed, Patient's Cardiovascular Status Stable, Respiratory Function Stable, Patent Airway, No signs of Nausea or vomiting and Pain level controlled              Post-op Vital Signs: Reviewed and stable  Last Vitals:  Filed Vitals:   03/07/15 1545  BP: 105/56  Pulse: 73  Temp: 36.6 C  Resp: 18    Complications: No apparent anesthesia complications

## 2015-03-08 ENCOUNTER — Encounter (HOSPITAL_COMMUNITY): Payer: Self-pay | Admitting: Obstetrics and Gynecology

## 2015-03-09 NOTE — Op Note (Signed)
NAMCaren Macadam:  GOINS, Arisha                 ACCOUNT NO.:  [id number]  MEDICAL RECORD NO.:  [id number]  LOCATION:  [facility]                          FACILITY:  [facility]  PHYSICIAN:  Malva LimesMark Brenn Deziel, M.D.    DATE OF BIRTH:  1990-10-12  DATE OF PROCEDURE:  03/07/2015 DATE OF DISCHARGE:  03/07/2015                              OPERATIVE REPORT   PREOPERATIVE DIAGNOSIS:  Missed abortion.  POSTOPERATIVE DIAGNOSIS:  Missed abortion.  PROCEDURE:  Dilation and curettage.  SURGEON:  Malva LimesMark Stormie Ventola, M.D.  ANESTHESIA:  Local, MAC, and general.  ESTIMATED BLOOD LOSS:  50 mL.  SPECIMENS:  Products of conception sent to Pathology.  DRAINS:  Red rubber catheter to bladder.  PROCEDURE IN DETAIL:  The patient was taken to the operating room where she was placed in a dorsal supine position.  She was prepped and draped in the usual fashion for this procedure.  MAC anesthesia was administered.  The bladder was drained with a red rubber catheter.  A sterile speculum was placed in the vagina.  A 20 mL of 1% lidocaine was used for paracervical block.  A single-tooth tenaculum was applied to the anterior cervical lip.  The cervix was then serially dilated to a 5637- [place].  A 12-mm suction cannula was placed into the uterine cavity, and products of conception were withdrawn.  The patient was not tolerating the procedure well; and therefore, a general anesthetic was administered.  Procedure was continued.  A sharp curettage was then performed followed by a repeat suction.  The patient was taken to the recovery room in stable condition.  Instrument and lap count was correct x2.  The patient will be discharged to home with ibuprofen p.r.n.  She will follow up in the office in 4 weeks.          ______________________________ Malva LimesMark Kollins Fenter, M.D.     MA/MEDQ  D:  03/09/2015  T:  03/09/2015  Job:  [id number]

## 2015-03-11 ENCOUNTER — Emergency Department (HOSPITAL_COMMUNITY)
Admission: EM | Admit: 2015-03-11 | Discharge: 2015-03-11 | Disposition: A | Discharge status: Home / Self Care | Payer: 59 | Attending: Emergency Medicine | Admitting: Emergency Medicine

## 2015-03-11 ENCOUNTER — Encounter (HOSPITAL_COMMUNITY): Payer: Self-pay

## 2015-03-11 DIAGNOSIS — Z9889 Other specified postprocedural states: Secondary | ICD-10-CM | POA: Diagnosis not present

## 2015-03-11 DIAGNOSIS — N939 Abnormal uterine and vaginal bleeding, unspecified: Secondary | ICD-10-CM | POA: Insufficient documentation

## 2015-03-11 DIAGNOSIS — Z79899 Other long term (current) drug therapy: Secondary | ICD-10-CM | POA: Diagnosis not present

## 2015-03-11 LAB — COMPREHENSIVE METABOLIC PANEL
ALT: 14 U/L (ref 14–54)
AST: 17 U/L (ref 15–41)
Albumin: 3.1 g/dL — ABNORMAL LOW (ref 3.5–5.0)
Alkaline Phosphatase: 76 U/L (ref 38–126)
Anion gap: 6 (ref 5–15)
CALCIUM: 8.7 mg/dL — AB (ref 8.9–10.3)
CO2: 25 mmol/L (ref 22–32)
Chloride: 107 mmol/L (ref 101–111)
Creatinine, Ser: 0.61 mg/dL (ref 0.44–1.00)
GFR calc non Af Amer: >60 mL/min (ref >60)
Glucose, Bld: 84 mg/dL (ref 65–99)
Potassium: 4 mmol/L (ref 3.5–5.1)
Sodium: 138 mmol/L (ref 135–145)
TOTAL PROTEIN: 6.8 g/dL (ref 6.5–8.1)
Total Bilirubin: 0.4 mg/dL (ref 0.3–1.2)

## 2015-03-11 LAB — CBC WITH DIFFERENTIAL/PLATELET
BASOS PCT: 0 % (ref 0–1)
Basophils Absolute: 0 10*3/uL (ref 0.0–0.1)
Eosinophils Absolute: 0.2 10*3/uL (ref 0.0–0.7)
Eosinophils Relative: 2 % (ref 0–5)
HEMATOCRIT: 36.4 % (ref 36.0–46.0)
HEMOGLOBIN: 12.3 g/dL (ref 12.0–15.0)
LYMPHS ABS: 2.2 10*3/uL (ref 0.7–4.0)
Lymphocytes Relative: 24 % (ref 12–46)
MCH: 29 pg (ref 26.0–34.0)
MCHC: 33.8 g/dL (ref 30.0–36.0)
MCV: 85.8 fL (ref 78.0–100.0)
MONO ABS: 0.6 10*3/uL (ref 0.1–1.0)
MONOS PCT: 6 % (ref 3–12)
NEUTROS ABS: 6 10*3/uL (ref 1.7–7.7)
NEUTROS PCT: 67 % (ref 43–77)
Platelets: 211 10*3/uL (ref 150–400)
RBC: 4.24 MIL/uL (ref 3.87–5.11)
RDW: 14 % (ref 11.5–15.5)
WBC: 8.9 10*3/uL (ref 4.0–10.5)

## 2015-03-11 MED ORDER — OXYCODONE-ACETAMINOPHEN 5-325 MG PO TABS
1.0000 | ORAL_TABLET | Freq: Once | ORAL | Status: AC
  Administered 2015-03-11: 1 via ORAL

## 2015-03-11 MED ORDER — OXYCODONE-ACETAMINOPHEN 5-325 MG PO TABS
1.0000 | ORAL_TABLET | Freq: Four times a day (QID) | ORAL | Status: DC | PRN
Start: 2015-03-11 — End: 2015-12-17

## 2015-03-11 MED ORDER — OXYCODONE-ACETAMINOPHEN 5-325 MG PO TABS
ORAL_TABLET | ORAL | Status: AC
  Filled 2015-03-11: qty 1

## 2015-03-11 NOTE — ED Notes (Signed)
Pt stable, ambulatory, states understanding of discharge instructions 

## 2015-03-11 NOTE — ED Provider Notes (Signed)
CSN: [id number]     Arrival date & time 03/11/15  1510 History   First MD Initiated Contact with Patient 03/11/15 1635     Chief Complaint  Patient presents with  . Vaginal Bleeding  . Post-op Problem     (Consider location/radiation/quality/duration/timing/severity/associated sxs/prior Treatment) HPI    PCP: No PCP Per Patient Blood pressure 112/74, pulse 88, temperature 98.6 F (37 C), temperature source Oral, resp. rate 18, height  (1.702 m), weight 275 lb (124.739 kg), last menstrual period 11/15/2014, SpO2 98 %, unknown if currently breastfeeding.  Felicia [name] is a 24 y.o.female with a significant PMH of Dilation and Evacuation 4 days ago after a missed miscarriage presents to the ER with complaints of pelvic cramping and spotting w/ clots.  She bleed  A little bit the first and second day after the procedure but then she had no bleeding yesterday. Today around 4 am she developed small clots, cramping and spotting. She denies that the bleeding is worse than a typical period for her. The cramping improves with Tylenol. She has had no fever. She had 1 episode of vomiting on arrival but none before or since then. Does not feel nauseous and is not currently having any pain. She denies dizziness, fatigue, weakness, back pain, headache.   The patient denies diaphoresis, fever, headache, weakness (general or focal), confusion, change of vision,  neck pain, dysphagia, aphagia, chest pain, shortness of breath,  back pain, abdominal pains, nausea, diarrhea, lower extremity swelling, rash.   History reviewed. No pertinent past medical history. Past Surgical History  Procedure Laterality Date  . Cholecystectomy  2011  . Dilation and evacuation N/A 03/07/2015    Procedure: DILATATION AND EVACUATION;  Surgeon: Levi Aland, MD;  Location: [facility];  Service: Gynecology;  Laterality: N/A;   No family history on file. History  Substance Use Topics  . Smoking status: Never Smoker   .  Smokeless tobacco: Not on file  . Alcohol Use: No   OB History    Gravida Para Term Preterm AB TAB SAB Ectopic Multiple Living   Review of Systems  10 Systems reviewed and are negative for acute change except as noted in the HPI.     Allergies  Review of patient's allergies indicates no known allergies.  Home Medications   Prior to Admission medications   Medication Sig Start Date End Date Taking? Authorizing Provider  ibuprofen (ADVIL,MOTRIN) 200 MG tablet Take 400 mg by mouth every 6 (six) hours as needed for moderate pain.   Yes Historical Provider, MD  acetaminophen (TYLENOL) 500 MG tablet Take 500 mg by mouth daily as needed for mild pain or moderate pain.    Historical Provider, MD  oxyCODONE-acetaminophen (PERCOCET/ROXICET) 5-325 MG per tablet Take 1 tablet by mouth every 6 (six) hours as needed for severe pain. 03/11/15   Marlon Pel, PA-C  Prenatal Vit-Fe Fumarate-FA (PRENATAL MULTIVITAMIN) TABS tablet Take 1 tablet by mouth daily at 12 noon.    Historical Provider, MD   BP 104/56 mmHg  Pulse 73  Temp(Src) 98.6 F (37 C) (Oral)  Resp 18  Ht  (1.702 m)  Wt 275 lb (124.739 kg)  BMI 43.06 kg/m2  SpO2 100%  LMP 11/15/2014 (Approximate) Physical Exam  Constitutional: She appears well-developed and well-nourished. No distress.  HENT:  Head: Normocephalic and atraumatic.  Eyes: Pupils are equal, round, and reactive to light.  Neck:  Normal range of motion. Neck supple.  Cardiovascular: Normal rate and regular rhythm.   Pulmonary/Chest: Effort normal.  Abdominal: Soft. There is no tenderness. There is no rigidity, no rebound, no guarding and no CVA tenderness.  Genitourinary: There is no rash or tenderness on the right labia. There is no rash or tenderness on the left labia. Uterus is enlarged. Cervix exhibits no motion tenderness, no discharge and no friability. Right adnexum displays no mass and no tenderness. Left adnexum displays no mass and no  tenderness. There is bleeding in the vagina.  Small amount of blood in vaginal vault. Cervix is closed.  Neurological: She is alert.  Skin: Skin is warm and dry.  Nursing note and vitals reviewed.   ED Course  Procedures (including critical care time) Labs Review Labs Reviewed  COMPREHENSIVE METABOLIC PANEL - Abnormal; Notable for the following:    BUN <5 (*)    Calcium 8.7 (*)    Albumin 3.1 (*)    All other components within normal limits  CBC WITH DIFFERENTIAL/PLATELET  GC/CHLAMYDIA PROBE AMP ([place]) NOT AT [facility]    Imaging Review No results found.   EKG Interpretation None      MDM   Final diagnoses:  Vaginal bleeding    [facility] about the patient's presentation and recent procedure. She feels like as long as there is not significant amount of active bleeding, she is not anemic, not febrile or having significant pain then this course is appropriate given the procedure. She recommend she calls her OB/GYN on Monday to be seen for close follow-up.  Patient is not anemic, febrile, no further vomiting, she is comfortable with going home and is not having pain. She will call Dr. Dareen PianoAnderson on Monday morning for follow-up. Dr. Tenny Crawoss did not recommend US at this time. Wet prep and GC were performed but lab was unable to find it. Therefore without discharge or fevers, these were discontinued.  Medications  oxyCODONE-acetaminophen (PERCOCET/ROXICET) 5-325 MG per tablet 1 tablet (1 tablet Oral Given 03/11/15 1627)    24 y.o.Felicia [name]'s evaluation in the Emergency Department is complete. It has been determined that no acute conditions requiring further emergency intervention are present at this time. The patient/guardian have been advised of the diagnosis and plan. We have discussed signs and symptoms that warrant return to the ED, such as changes or worsening in symptoms.  Vital signs are stable at discharge. Filed Vitals:   03/11/15 1902   BP: 104/56  Pulse: 73  Temp:   Resp:     Patient/guardian has voiced understanding and agreed to follow-up with the PCP or specialist.   Marlon Peliffany Daymon Hora, PA-C 03/11/15 1929  Lorre NickAnthony Allen, MD 03/12/15 1550

## 2015-03-11 NOTE — ED Notes (Signed)
D/t miscarriage had a D and E on the 5th and woke up this morning cramping really bad and noticed she was having bleeding. Passing clots. Was told she would spot for a couple days. Vomited x 1 while waiting to be triaged.

## 2015-03-11 NOTE — ED Notes (Signed)
Called main lab re pelvic labs.  They stated they just received the labs.

## 2015-03-13 LAB — GC/CHLAMYDIA PROBE AMP (~~LOC~~) NOT AT ARMC
Chlamydia: NEGATIVE
Neisseria Gonorrhea: NEGATIVE

## 2015-08-15 LAB — OB RESULTS CONSOLE HIV ANTIBODY (ROUTINE TESTING)
HIV: NONREACTIVE
HIV: NONREACTIVE

## 2015-08-15 LAB — OB RESULTS CONSOLE RUBELLA ANTIBODY, IGM
Rubella: IMMUNE
Rubella: IMMUNE

## 2015-08-15 LAB — OB RESULTS CONSOLE RPR
RPR: NONREACTIVE
RPR: NONREACTIVE

## 2015-08-15 LAB — OB RESULTS CONSOLE ABO/RH: RH TYPE: POSITIVE

## 2015-08-15 LAB — OB RESULTS CONSOLE HEPATITIS B SURFACE ANTIGEN
Hepatitis B Surface Ag: NEGATIVE
Hepatitis B Surface Ag: NEGATIVE

## 2015-08-15 LAB — OB RESULTS CONSOLE GC/CHLAMYDIA
Chlamydia: NEGATIVE
Chlamydia: NEGATIVE
GC PROBE AMP, GENITAL: NEGATIVE
Gonorrhea: NEGATIVE

## 2015-08-15 LAB — OB RESULTS CONSOLE ANTIBODY SCREEN: ANTIBODY SCREEN: NEGATIVE

## 2015-12-16 ENCOUNTER — Encounter (HOSPITAL_COMMUNITY): Payer: Self-pay | Admitting: *Deleted

## 2015-12-16 ENCOUNTER — Inpatient Hospital Stay (EMERGENCY_DEPARTMENT_HOSPITAL)
Admission: AD | Admit: 2015-12-16 | Discharge: 2015-12-17 | Disposition: A | Discharge status: Home / Self Care | Payer: Managed Care, Other (non HMO) | Source: Ambulatory Visit | Attending: Obstetrics and Gynecology | Admitting: Obstetrics and Gynecology

## 2015-12-16 DIAGNOSIS — M549 Dorsalgia, unspecified: Secondary | ICD-10-CM | POA: Diagnosis not present

## 2015-12-16 DIAGNOSIS — O9989 Other specified diseases and conditions complicating pregnancy, childbirth and the puerperium: Secondary | ICD-10-CM

## 2015-12-16 DIAGNOSIS — O2342 Unspecified infection of urinary tract in pregnancy, second trimester: Secondary | ICD-10-CM

## 2015-12-16 DIAGNOSIS — O26892 Other specified pregnancy related conditions, second trimester: Secondary | ICD-10-CM

## 2015-12-16 LAB — URINALYSIS, ROUTINE W REFLEX MICROSCOPIC
Bilirubin Urine: NEGATIVE
GLUCOSE, UA: NEGATIVE mg/dL
Hgb urine dipstick: NEGATIVE
Ketones, ur: NEGATIVE mg/dL
Nitrite: NEGATIVE
PROTEIN: NEGATIVE mg/dL
Specific Gravity, Urine: 1.025 (ref 1.005–1.030)
pH: 6 (ref 5.0–8.0)

## 2015-12-16 LAB — URINE MICROSCOPIC-ADD ON

## 2015-12-16 MED ORDER — CEPHALEXIN 500 MG PO CAPS: 500.0000 mg | ORAL_CAPSULE | Freq: Four times a day (QID) | ORAL | Status: DC

## 2015-12-16 MED ORDER — CEPHALEXIN 500 MG PO CAPS
500.0000 mg | ORAL_CAPSULE | Freq: Once | ORAL | Status: AC
Start: 2015-12-16 — End: 2015-12-16
  Administered 2015-12-16: 500 mg via ORAL
  Filled 2015-12-16: qty 1

## 2015-12-16 NOTE — Discharge Instructions (Signed)
Pregnancy and Urinary Tract Infection  A urinary tract infection (UTI) is a bacterial infection of the urinary tract. Infection of the urinary tract can include the ureters, kidneys (pyelonephritis), bladder (cystitis), and urethra (urethritis). All pregnant women should be screened for bacteria in the urinary tract. Identifying and treating a UTI will decrease the risk of preterm labor and developing more serious infections in both the mother and baby.  CAUSES  Bacteria germs cause almost all UTIs.   RISK FACTORS  Many factors can increase your chances of getting a UTI during pregnancy. These include:  · Having a short urethra.  · Poor toilet and hygiene habits.  · Sexual intercourse.  · Blockage of urine along the urinary tract.  · Problems with the pelvic muscles or nerves.  · Diabetes.  · Obesity.  · Bladder problems after having several children.  · Previous history of UTI.  SIGNS AND SYMPTOMS   · Pain, burning, or a stinging feeling when urinating.  · Suddenly feeling the need to urinate right away (urgency).  · Loss of bladder control (urinary incontinence).  · Frequent urination, more than is common with pregnancy.  · Lower abdominal or back discomfort.  · Cloudy urine.  · Blood in the urine (hematuria).  · Fever.   When the kidneys are infected, the symptoms may be:  · Back pain.  · Flank pain on the right side more so than the left.  · Fever.  · Chills.  · Nausea.  · Vomiting.  DIAGNOSIS   A urinary tract infection is usually diagnosed through urine tests. Additional tests and procedures are sometimes done. These may include:  · Ultrasound exam of the kidneys, ureters, bladder, and urethra.  · Looking in the bladder with a lighted tube (cystoscopy).  TREATMENT  Typically, UTIs can be treated with antibiotic medicines.   HOME CARE INSTRUCTIONS   · Only take over-the-counter or prescription medicines as directed by your health care provider. If you were prescribed antibiotics, take them as directed. Finish  them even if you start to feel better.  · Drink enough fluids to keep your urine clear or pale yellow.  · Do not have sexual intercourse until the infection is gone and your health care provider says it is okay.  · Make sure you are tested for UTIs throughout your pregnancy. These infections often come back.   Preventing a UTI in the Future  · Practice good toilet habits. Always wipe from front to back. Use the tissue only once.  · Do not hold your urine. Empty your bladder as soon as possible when the urge comes.  · Do not douche or use deodorant sprays.  · Wash with soap and warm water around the genital area and the anus.  · Empty your bladder before and after sexual intercourse.  · Wear underwear with a cotton crotch.  · Avoid caffeine and carbonated drinks. They can irritate the bladder.  · Drink cranberry juice or take cranberry pills. This may decrease the risk of getting a UTI.  · Do not drink alcohol.  · Keep all your appointments and tests as scheduled.   SEEK MEDICAL CARE IF:   · Your symptoms get worse.  · You are still having fevers 2 or more days after treatment begins.  · You have a rash.  · You feel that you are having problems with medicines prescribed.  · You have abnormal vaginal discharge.  SEEK IMMEDIATE MEDICAL CARE IF:   · You have back or flank   pain.  · You have chills.  · You have blood in your urine.  · You have nausea and vomiting.  · You have contractions of your uterus.  · You have a gush of fluid from the vagina.  MAKE SURE YOU:  · Understand these instructions.    · Will watch your condition.    · Will get help right away if you are not doing well or get worse.       This information is not intended to replace advice given to you by your health care provider. Make sure you discuss any questions you have with your health care provider.     Document Released: 12/14/2010 Document Revised: 06/09/2013 Document Reviewed: 03/18/2013  Elsevier Interactive Patient Education ©2016 Elsevier  Inc.

## 2015-12-16 NOTE — MAU Provider Note (Signed)
History     CSN: [id number]  Arrival date and time: 12/16/15 2147   First Provider Initiated Contact with Patient 12/16/15 2239      Chief Complaint  Patient presents with  . Pelvic Pain  . Back Pain   HPI   [name] is a 25 y.o. female [id number] at [redacted]w[redacted]d presenting with right lower back pain that radiates to her right side. The pain started yesterday morning and worsened just slightly around 1600 today. The pain is sharp, and comes and goes. She has never had this pain before.  She called the on call nurse because she thought she may have a UTI; she was instructed to come into MAU.   Denies vaginal bleeding + fetal movement Denies leaking of fluid.   OB History    Gravida Para Term Preterm AB TAB SAB Ectopic Multiple Living   History reviewed. No pertinent past medical history.  Past Surgical History  Procedure Laterality Date  . Cholecystectomy  2011  . Dilation and evacuation N/A 03/07/2015    Procedure: DILATATION AND EVACUATION;  Surgeon: Felicia Aland, MD;  Location: [facility];  Service: Gynecology;  Laterality: N/A;    No family history on file.  Social History  Substance Use Topics  . Smoking status: Never Smoker   . Smokeless tobacco: None  . Alcohol Use: No    Allergies: No Known Allergies  Prescriptions prior to admission  Medication Sig Dispense Refill Last Dose  . acetaminophen (TYLENOL) 500 MG tablet Take 500 mg by mouth daily as needed for mild pain or moderate pain.   Past Week at Unknown time  . ibuprofen (ADVIL,MOTRIN) 200 MG tablet Take 400 mg by mouth every 6 (six) hours as needed for moderate pain.   03/11/2015 at Unknown time  . oxyCODONE-acetaminophen (PERCOCET/ROXICET) 5-325 MG per tablet Take 1 tablet by mouth every 6 (six) hours as needed for severe pain. 10 tablet 0   . Prenatal Vit-Fe Fumarate-FA (PRENATAL MULTIVITAMIN) TABS tablet Take 1 tablet by mouth daily at 12 noon.   Past Week at Unknown time    Results  for orders placed or performed during the hospital encounter of 12/16/15 (from the past 48 hour(s))  Urinalysis, Routine w reflex microscopic (not at [facility])     Status: Abnormal   Collection Time: 12/16/15 10:05 PM  Result Value Ref Range   Color, Urine YELLOW YELLOW   APPearance CLEAR CLEAR   Specific Gravity, Urine 1.025 1.005 - 1.030   pH 6.0 5.0 - 8.0   Glucose, UA NEGATIVE NEGATIVE mg/dL   Hgb urine dipstick NEGATIVE NEGATIVE   Bilirubin Urine NEGATIVE NEGATIVE   Ketones, ur NEGATIVE NEGATIVE mg/dL   Protein, ur NEGATIVE NEGATIVE mg/dL   Nitrite NEGATIVE NEGATIVE   Leukocytes, UA TRACE (A) NEGATIVE  Urine microscopic-add on     Status: Abnormal   Collection Time: 12/16/15 10:05 PM  Result Value Ref Range   Squamous Epithelial / LPF 0-5 (A) NONE SEEN   WBC, UA 0-5 0 - 5 WBC/hpf   RBC / HPF 0-5 0 - 5 RBC/hpf   Bacteria, UA FEW (A) NONE SEEN    Review of Systems  Constitutional: Negative for fever.  Genitourinary: Positive for dysuria, urgency and frequency. Negative for hematuria and flank pain.  Musculoskeletal: Positive for back pain.   Physical Exam   Blood pressure 133/76, pulse 78, temperature 97.9 F (36.6 C),  resp. rate 18, height 5\' 8"  (1.727 m), weight 291 lb 9.6 oz (132.269 kg), last menstrual period 06/13/2015, unknown if currently breastfeeding.  Physical Exam  Constitutional: She is oriented to person, place, and time. She appears well-developed and well-nourished. No distress.  Respiratory: Effort normal.  GI: There is no tenderness. There is no CVA tenderness.  Genitourinary:  Cervix: External os slightly open, internal os closed. Thick overall, middle position.    Musculoskeletal: Normal range of motion.  Neurological: She is alert and oriented to person, place, and time.  Skin: Skin is warm. She is not diaphoretic.  Psychiatric: Her behavior is normal.   Fetal Tracing: Baseline: 145 bpm  Variability: minimal  Accelerations: 10x10 Decelerations:  none Toco: none/ quiet    MAU Course  Procedures  None.   MDM  Urine culture sent.   Assessment and Plan   A:  1. UTI in pregnancy, antepartum, second trimester   2. Back pain affecting pregnancy in second trimester     P:  Discharge home in stable condition RX: Keflex Follow up with Dr. Vincente [name] as needed Return to MAU if symptoms worsen  Urine culture pending.    Duane LopeJennifer I Ameyah Bangura, NP 12/17/2015 2:52 AM

## 2015-12-16 NOTE — MAU Note (Signed)
Some pelvic pain and pressure since yesterday. Some pain in R lower back/buttocks. Denies LOF or bleeding

## 2015-12-17 ENCOUNTER — Encounter (HOSPITAL_COMMUNITY): Payer: Self-pay

## 2015-12-17 ENCOUNTER — Inpatient Hospital Stay (HOSPITAL_COMMUNITY): Payer: Managed Care, Other (non HMO)

## 2015-12-17 ENCOUNTER — Inpatient Hospital Stay (HOSPITAL_COMMUNITY)
Admission: AD | Admit: 2015-12-17 | Discharge: 2015-12-20 | DRG: 781 | Disposition: A | Discharge status: Home / Self Care | Payer: Managed Care, Other (non HMO) | Source: Ambulatory Visit | Attending: Obstetrics and Gynecology | Admitting: Obstetrics and Gynecology

## 2015-12-17 DIAGNOSIS — O2302 Infections of kidney in pregnancy, second trimester: Principal | ICD-10-CM

## 2015-12-17 DIAGNOSIS — Z3A26 26 weeks gestation of pregnancy: Secondary | ICD-10-CM

## 2015-12-17 DIAGNOSIS — O2300 Infections of kidney in pregnancy, unspecified trimester: Secondary | ICD-10-CM | POA: Diagnosis present

## 2015-12-17 DIAGNOSIS — R300 Dysuria: Secondary | ICD-10-CM | POA: Diagnosis not present

## 2015-12-17 DIAGNOSIS — N12 Tubulo-interstitial nephritis, not specified as acute or chronic: Secondary | ICD-10-CM

## 2015-12-17 DIAGNOSIS — R109 Unspecified abdominal pain: Secondary | ICD-10-CM

## 2015-12-17 DIAGNOSIS — O2342 Unspecified infection of urinary tract in pregnancy, second trimester: Secondary | ICD-10-CM | POA: Diagnosis not present

## 2015-12-17 DIAGNOSIS — M549 Dorsalgia, unspecified: Secondary | ICD-10-CM | POA: Diagnosis not present

## 2015-12-17 DIAGNOSIS — O26892 Other specified pregnancy related conditions, second trimester: Secondary | ICD-10-CM | POA: Diagnosis not present

## 2015-12-17 LAB — PROTEIN / CREATININE RATIO, URINE
Creatinine, Urine: 214 mg/dL
PROTEIN CREATININE RATIO: 0.08 mg/mg{creat} (ref 0.00–0.15)
TOTAL PROTEIN, URINE: 17 mg/dL

## 2015-12-17 LAB — CBC WITH DIFFERENTIAL/PLATELET
Basophils Absolute: 0 10*3/uL (ref 0.0–0.1)
Basophils Relative: 0 %
EOS ABS: 0.2 10*3/uL (ref 0.0–0.7)
Eosinophils Relative: 2 %
HEMATOCRIT: 34.1 % — AB (ref 36.0–46.0)
Hemoglobin: 11.7 g/dL — ABNORMAL LOW (ref 12.0–15.0)
Lymphocytes Relative: 17 %
Lymphs Abs: 2.4 10*3/uL (ref 0.7–4.0)
MCH: 30.4 pg (ref 26.0–34.0)
MCHC: 34.3 g/dL (ref 30.0–36.0)
MCV: 88.6 fL (ref 78.0–100.0)
MONOS PCT: 5 %
Monocytes Absolute: 0.6 10*3/uL (ref 0.1–1.0)
Neutro Abs: 10.6 10*3/uL — ABNORMAL HIGH (ref 1.7–7.7)
Neutrophils Relative %: 76 %
Platelets: 232 10*3/uL (ref 150–400)
RBC: 3.85 MIL/uL — ABNORMAL LOW (ref 3.87–5.11)
RDW: 14 % (ref 11.5–15.5)
WBC: 13.8 10*3/uL — ABNORMAL HIGH (ref 4.0–10.5)

## 2015-12-17 LAB — COMPREHENSIVE METABOLIC PANEL
ALK PHOS: 92 U/L (ref 38–126)
ALT: 14 U/L (ref 14–54)
ANION GAP: 6 (ref 5–15)
AST: 14 U/L — ABNORMAL LOW (ref 15–41)
Albumin: 2.9 g/dL — ABNORMAL LOW (ref 3.5–5.0)
BUN: 7 mg/dL (ref 6–20)
CALCIUM: 8.4 mg/dL — AB (ref 8.9–10.3)
CO2: 20 mmol/L — ABNORMAL LOW (ref 22–32)
CREATININE: 0.53 mg/dL (ref 0.44–1.00)
Chloride: 110 mmol/L (ref 101–111)
Glucose, Bld: 98 mg/dL (ref 65–99)
Potassium: 4 mmol/L (ref 3.5–5.1)
SODIUM: 136 mmol/L (ref 135–145)
TOTAL PROTEIN: 6.6 g/dL (ref 6.5–8.1)
Total Bilirubin: 0.5 mg/dL (ref 0.3–1.2)

## 2015-12-17 LAB — TYPE AND SCREEN
ABO/RH(D): A POS
Antibody Screen: NEGATIVE

## 2015-12-17 MED ORDER — CEFAZOLIN SODIUM-DEXTROSE 2-4 GM/100ML-% IV SOLN
2.0000 g | Freq: Once | INTRAVENOUS | Status: AC
  Administered 2015-12-17: 2 g via INTRAVENOUS
  Filled 2015-12-17: qty 100

## 2015-12-17 MED ORDER — CEFAZOLIN SODIUM 1-5 GM-% IV SOLN
1.0000 g | Freq: Three times a day (TID) | INTRAVENOUS | Status: DC
  Administered 2015-12-17 – 2015-12-19 (×5): 1 g via INTRAVENOUS
  Filled 2015-12-17 (×6): qty 50

## 2015-12-17 MED ORDER — ACETAMINOPHEN 325 MG PO TABS
650.0000 mg | ORAL_TABLET | ORAL | Status: DC | PRN
  Administered 2015-12-17 – 2015-12-19 (×4): 650 mg via ORAL
  Filled 2015-12-17 (×4): qty 2

## 2015-12-17 MED ORDER — ONDANSETRON HCL 4 MG/2ML IJ SOLN
4.0000 mg | Freq: Once | INTRAMUSCULAR | Status: AC
  Administered 2015-12-17: 4 mg via INTRAVENOUS
  Filled 2015-12-17: qty 2

## 2015-12-17 MED ORDER — ZOLPIDEM TARTRATE 5 MG PO TABS: 5.0000 mg | ORAL_TABLET | Freq: Every evening | ORAL | Status: DC | PRN

## 2015-12-17 MED ORDER — ONDANSETRON 4 MG PO TBDP
8.0000 mg | ORAL_TABLET | Freq: Three times a day (TID) | ORAL | Status: DC | PRN
  Administered 2015-12-17: 8 mg via ORAL
  Filled 2015-12-17: qty 2

## 2015-12-17 MED ORDER — LACTATED RINGERS IV BOLUS (SEPSIS)
1000.0000 mL | Freq: Once | INTRAVENOUS | Status: AC
  Administered 2015-12-17: 1000 mL via INTRAVENOUS

## 2015-12-17 MED ORDER — PRENATAL MULTIVITAMIN CH
1.0000 | ORAL_TABLET | Freq: Every day | ORAL | Status: DC
  Administered 2015-12-18 – 2015-12-19 (×2): 1 via ORAL
  Filled 2015-12-17 (×3): qty 1

## 2015-12-17 MED ORDER — DOCUSATE SODIUM 100 MG PO CAPS
100.0000 mg | ORAL_CAPSULE | Freq: Every day | ORAL | Status: DC
  Administered 2015-12-18 – 2015-12-20 (×3): 100 mg via ORAL
  Filled 2015-12-17 (×5): qty 1

## 2015-12-17 MED ORDER — CALCIUM CARBONATE ANTACID 500 MG PO CHEW
2.0000 | CHEWABLE_TABLET | ORAL | Status: DC | PRN
  Filled 2015-12-17: qty 2

## 2015-12-17 MED ORDER — LACTATED RINGERS IV SOLN
INTRAVENOUS | Status: DC
  Administered 2015-12-18 – 2015-12-19 (×3): via INTRAVENOUS

## 2015-12-17 NOTE — MAU Provider Note (Signed)
Chief Complaint:  Fever and Back Pain   First Provider Initiated Contact with Patient 12/17/15 1618     HPI: Felicia [name] is a 25 y.o. [id number] at [redacted]w[redacted]d who presents to maternity admissions reporting dysuria, worsening right flank pain and new-onset fever of 100.7 this morning and nausea and vomiting since arriving to MAU. Vomited 4 times. Was seen in maternity admissions yesterday for 15 2017. Diagnosed with UTI. Prescribed Keflex. Took a dose last night and this morning as directed. Urine culture pending.  Location: Right flank Quality: Aching Severity: Moderate Duration: 3 days Course: Worsening Timing: Constant Modifying factors: Worse with lying flat. Associated signs and symptoms: Positive for fever, nausea, vomiting, dysuria, frequency. Negative for hematuria, low abdominal pain.  Denies contractions, leakage of fluid or vaginal bleeding. Good fetal movement.   Past Medical History: History reviewed. No pertinent past medical history.  Past obstetric history: OB History  Gravida Para Term Preterm AB SAB TAB Ectopic Multiple Living  # Outcome Date GA Lbr Len/2nd Weight Sex Delivery Anes PTL Lv  4 Current           3 Term 03/24/14 [redacted]w[redacted]d 06:37 / 00:20 9 lb 3 oz (4.167 kg) M Vag-Spont EPI  Y  2 Term 2013 [redacted]w[redacted]d 03:00 9 lb (4.082 kg)  Vag-Spont EPI N Y  1 SAB               Past Surgical History: Past Surgical History  Procedure Laterality Date  . Cholecystectomy  2011  . Dilation and evacuation N/A 03/07/2015    Procedure: DILATATION AND EVACUATION;  Surgeon: Levi Aland, MD;  Location: [facility];  Service: Gynecology;  Laterality: N/A;     Family History: History reviewed. No pertinent family history.  Social History: Social History  Substance Use Topics  . Smoking status: Never Smoker   . Smokeless tobacco: None  . Alcohol Use: No    Allergies: No Known Allergies  Meds:  Prescriptions prior to admission  Medication Sig Dispense Refill Last  Dose  . cephALEXin (KEFLEX) 500 MG capsule Take 1 capsule (500 mg total) by mouth 4 (four) times daily. 20 capsule 0 12/16/2015 at Unknown time  . Prenatal Vit-Fe Fumarate-FA (PRENATAL MULTIVITAMIN) TABS tablet Take 1 tablet by mouth daily at 12 noon.    12/16/2015 at Unknown time  . acetaminophen (TYLENOL) 500 MG tablet Take 500 mg by mouth daily as needed for mild pain or moderate pain.   prn    I have reviewed patient's Past Medical Hx, Surgical Hx, Family Hx, Social Hx, medications and allergies.   ROS:  Review of Systems  Constitutional: Positive for fever and chills.  Gastrointestinal: Positive for nausea and vomiting. Negative for abdominal pain and diarrhea.  Genitourinary: Positive for dysuria, frequency and flank pain. Negative for urgency, hematuria, vaginal bleeding and pelvic pain.  Musculoskeletal: Negative for myalgias.    Physical Exam   Patient Vitals for the past 24 hrs:  BP Temp Temp src Pulse Resp Weight  12/17/15 1813 104/62 mmHg 98.3 F (36.8 C) Oral 93 - -  12/17/15 1744 125/74 mmHg - - 90 - -  12/17/15 1551 154/75 mmHg 98 F (36.7 C) Oral 94 18 290 lb 12.8 oz (131.906 kg)   Constitutional: Well-developed, well-nourished female in mild distress. Restless, needing to sit up. Actively vomiting. Cardiovascular: normal rate Respiratory: normal effort GI: Abd soft, non-tender, gravid appropriate for gestational age. MS:  Extremities nontender, no edema, normal ROM Neurologic: Alert and oriented x 4.  GU: Mild right CVAT.  Pelvic: Deferred    FHT:  Baseline 150 , minimal variability, 10x10 accelerations present, no decelerations. Reassuring for gestational age Contractions: None   Labs: Results for orders placed or performed during the hospital encounter of 12/17/15 (from the past 24 hour(s))  Protein / creatinine ratio, urine     Status: None   Collection Time: 12/17/15  3:55 PM  Result Value Ref Range   Creatinine, Urine 214.00 mg/dL   Total Protein, Urine  17 mg/dL   Protein Creatinine Ratio 0.08 0.00 - 0.15 mg/mg[Cre]  CBC with Differential/Platelet     Status: Abnormal   Collection Time: 12/17/15  4:50 PM  Result Value Ref Range   WBC 13.8 (H) 4.0 - 10.5 K/uL   RBC 3.85 (L) 3.87 - 5.11 MIL/uL   Hemoglobin 11.7 (L) 12.0 - 15.0 g/dL   HCT 62.1 (L) 30.8 - 65.7 %   MCV 88.6 78.0 - 100.0 fL   MCH 30.4 26.0 - 34.0 pg   MCHC 34.3 30.0 - 36.0 g/dL   RDW 84.6 96.2 - 95.2 %   Platelets 232 150 - 400 K/uL   Neutrophils Relative % 76 %   Neutro Abs 10.6 (H) 1.7 - 7.7 K/uL   Lymphocytes Relative 17 %   Lymphs Abs 2.4 0.7 - 4.0 K/uL   Monocytes Relative 5 %   Monocytes Absolute 0.6 0.1 - 1.0 K/uL   Eosinophils Relative 2 %   Eosinophils Absolute 0.2 0.0 - 0.7 K/uL   Basophils Relative 0 %   Basophils Absolute 0.0 0.0 - 0.1 K/uL  Comprehensive metabolic panel     Status: Abnormal   Collection Time: 12/17/15  4:50 PM  Result Value Ref Range   Sodium 136 135 - 145 mmol/L   Potassium 4.0 3.5 - 5.1 mmol/L   Chloride 110 101 - 111 mmol/L   CO2 20 (L) 22 - 32 mmol/L   Glucose, Bld 98 65 - 99 mg/dL   BUN 7 6 - 20 mg/dL   Creatinine, Ser 8.41 0.44 - 1.00 mg/dL   Calcium 8.4 (L) 8.9 - 10.3 mg/dL   Total Protein 6.6 6.5 - 8.1 g/dL   Albumin 2.9 (L) 3.5 - 5.0 g/dL   AST 14 (L) 15 - 41 U/L   ALT 14 14 - 54 U/L   Alkaline Phosphatase 92 38 - 126 U/L   Total Bilirubin 0.5 0.3 - 1.2 mg/dL   GFR calc non Af Amer >60 >60 mL/min   GFR calc Af Amer >60 >60 mL/min   Anion gap 6 5 - 15    Imaging:  US Renal  12/17/2015  CLINICAL DATA:  Right-sided flank pain, [redacted] weeks pregnant EXAM: RENAL / URINARY TRACT ULTRASOUND COMPLETE COMPARISON:  None. FINDINGS: Right Kidney: Length: 11.9 cm. Minimal fullness of the collecting system is noted although normal ureteral jet is seen. No true obstruction is noted. Left Kidney: Length: 12.1 cm. Minimal fullness of the collecting system is noted. No definitive obstruction is seen. Bladder: Bilateral ureteral jets are  noted. The bladder is well distended. No acute abnormality is seen. IMPRESSION: Mild fullness of the collecting systems likely related to compression from the gravid uterus. No definitive obstruction is seen. Electronically Signed   By: Alcide Clever M.D.   On: 12/17/2015 17:37    MAU Course: Informed Dr. Vincente Poli patient's worsening symptoms, concerning for pyelonephritis. CBC, renal ultrasound, Ancef, Zofran ordered.  BP elevated. CMP and protein creatinine ratio ordered. Denies preeclampsia symptoms.  Repeat BPs normal.  Reviewed labs, ultrasound with Dr. Vincente PoliGrewal. Admit antenatal for IV antibiotics.  MDM: - 25 year old female at 2326 weeks 5 days gestation with suspected pyelonephritis based on worsening flank pain, fever at home and new onset of nausea and vomiting. Failed outpatient treatment. Needs IV antibiotics.  Assessment: 1. Right flank pain-suspect pyelonephritis    Plan: Admit antenatal. IV Ancef. Pain meds, antiemetics when necessary.  [place] Analeese [place], CNM 12/17/2015 7:07 PM

## 2015-12-17 NOTE — MAU Note (Signed)
Urine in lab 

## 2015-12-17 NOTE — MAU Note (Signed)
Dx with UTI last night.  This morning had fever 100.7, back pain has gotten worse. Called MD, was instructed to come in .

## 2015-12-17 NOTE — H&P (Signed)
Felicia [name] is a 25 y.o. G 4 P 2012 at 1026 w 5 days presents with 2 days of dysuria, frequency progressing to nausea, chills, vomiting and right sided flank pain.  History OB History    Gravida Para Term Preterm AB TAB SAB Ectopic Multiple Living   4 2 2  1  1   2      History reviewed. No pertinent past medical history. Past Surgical History  Procedure Laterality Date  . Cholecystectomy  2011  . Dilation and evacuation N/A 03/07/2015    Procedure: DILATATION AND EVACUATION;  Surgeon: Levi AlandMark E Anderson, MD;  Location: [facility];  Service: Gynecology;  Laterality: N/A;   Family History: family history is not on file. Social History:  reports that she has never smoked. She does not have any smokeless tobacco history on file. She reports that she does not drink alcohol or use illicit drugs.   Prenatal Transfer Tool  Maternal Diabetes: No Genetic Screening: Normal Maternal Ultrasounds/Referrals: Normal Fetal Ultrasounds or other Referrals:  None Maternal Substance Abuse:  No Significant Maternal Medications:  None Significant Maternal Lab Results:  None Other Comments:  None  Review of Systems  Genitourinary: Positive for dysuria, urgency, frequency and flank pain.      Blood pressure 125/74, pulse 90, temperature 98 F (36.7 C), temperature source Oral, resp. rate 18, weight 131.906 kg (290 lb 12.8 oz), last menstrual period 06/13/2015, unknown if currently breastfeeding.   Fetal Exam Fetal State Assessment: Category I - tracings are normal.     Physical Exam  Nursing note and vitals reviewed. Constitutional: She appears well-developed.  HENT:  Head: Normocephalic.  Eyes: Pupils are equal, round, and reactive to light.  Neck: Normal range of motion. Neck supple.  Cardiovascular: Normal rate and regular rhythm.   Respiratory: Effort normal.  GI: Soft. There is tenderness.  Mild CVAT on the right No Left CVAT    Prenatal labs: ABO, Rh: --/--/A POS (07/05 1125) Antibody:  NEG (07/05 1125) Rubella:   RPR:    HBsAg:    HIV:    GBS:     Assessment/Plan: IUP at 26 w 5 days Probable Right Pyelonephritis Admit IV ANCEF Urine culture pending Repeat CBC and CMET in am Zofran prn Plan of discussed with patient and her husband   Jenaro Souder L 12/17/2015, 6:07 PM

## 2015-12-18 LAB — COMPREHENSIVE METABOLIC PANEL
ALK PHOS: 78 U/L (ref 38–126)
ALT: 11 U/L — AB (ref 14–54)
AST: 13 U/L — ABNORMAL LOW (ref 15–41)
Albumin: 2.4 g/dL — ABNORMAL LOW (ref 3.5–5.0)
Anion gap: 4 — ABNORMAL LOW (ref 5–15)
BUN: 5 mg/dL — AB (ref 6–20)
CO2: 22 mmol/L (ref 22–32)
CREATININE: 0.42 mg/dL — AB (ref 0.44–1.00)
Calcium: 8.1 mg/dL — ABNORMAL LOW (ref 8.9–10.3)
Chloride: 111 mmol/L (ref 101–111)
Glucose, Bld: 85 mg/dL (ref 65–99)
Potassium: 3.9 mmol/L (ref 3.5–5.1)
SODIUM: 137 mmol/L (ref 135–145)
TOTAL PROTEIN: 5.9 g/dL — AB (ref 6.5–8.1)
Total Bilirubin: 0.3 mg/dL (ref 0.3–1.2)

## 2015-12-18 LAB — CBC
HCT: 31.6 % — ABNORMAL LOW (ref 36.0–46.0)
HEMOGLOBIN: 10.5 g/dL — AB (ref 12.0–15.0)
MCH: 30 pg (ref 26.0–34.0)
MCHC: 33.2 g/dL (ref 30.0–36.0)
MCV: 90.3 fL (ref 78.0–100.0)
Platelets: 193 10*3/uL (ref 150–400)
RBC: 3.5 MIL/uL — AB (ref 3.87–5.11)
RDW: 14.2 % (ref 11.5–15.5)
WBC: 10.8 10*3/uL — ABNORMAL HIGH (ref 4.0–10.5)

## 2015-12-18 LAB — URINE CULTURE: SPECIAL REQUESTS: NORMAL

## 2015-12-18 NOTE — Progress Notes (Signed)
Patient ID: Felicia JosephAmber L Goins, female   DOB: 11/27/1990, [age] y.o.   MRN: [id number]   7135w6d  S// feeling better  O//BP 102/51 mmHg  Pulse 76  Temp(Src) 98 F (36.7 C) (Oral)  Resp 18  Ht 5\' 8"  (1.727 m)  Wt 290 lb 12.8 oz (131.906 kg)  BMI 44.23 kg/m2  LMP 06/13/2015  CBC    Component Value Date/Time   WBC 10.8* 12/18/2015 0602   RBC 3.50* 12/18/2015 0602   HGB 10.5* 12/18/2015 0602   HCT 31.6* 12/18/2015 0602   PLT 193 12/18/2015 0602   MCV 90.3 12/18/2015 0602   MCH 30.0 12/18/2015 0602   MCHC 33.2 12/18/2015 0602   RDW 14.2 12/18/2015 0602   LYMPHSABS 2.4 12/17/2015 1650   MONOABS 0.6 12/17/2015 1650   EOSABS 0.2 12/17/2015 1650   BASOSABS 0.0 12/17/2015 1650     Renal US>>WNL  C and S>>pending  A+P// 2435w6d, pyelo>>cont Ancef, plan change to PO tomorrow

## 2015-12-19 ENCOUNTER — Encounter (HOSPITAL_COMMUNITY): Payer: Self-pay | Admitting: *Deleted

## 2015-12-19 MED ORDER — SODIUM CHLORIDE 0.9% FLUSH
3.0000 mL | Freq: Two times a day (BID) | INTRAVENOUS | Status: DC
  Administered 2015-12-19 – 2015-12-20 (×3): 3 mL via INTRAVENOUS

## 2015-12-19 MED ORDER — SODIUM CHLORIDE 0.9% FLUSH: 3.0000 mL | INTRAVENOUS | Status: DC | PRN

## 2015-12-19 MED ORDER — CEPHALEXIN 500 MG PO CAPS
500.0000 mg | ORAL_CAPSULE | Freq: Four times a day (QID) | ORAL | Status: DC
  Administered 2015-12-19 – 2015-12-20 (×4): 500 mg via ORAL
  Filled 2015-12-19 (×5): qty 1

## 2015-12-19 MED ORDER — SODIUM CHLORIDE 0.9 % IV SOLN
250.0000 mL | INTRAVENOUS | Status: DC | PRN
Start: 2015-12-19 — End: 2015-12-20

## 2015-12-19 NOTE — Progress Notes (Signed)
Feeling better, still some mild pain in right flank, no N/V  VSS Afeb  Uterus soft, NT Mild right CVAT  FHT cat one  Urine C&S mixed flora  A/P: Pyelonephritis         Change to Keflex po         Observe x 24 hours, possible D/C in am

## 2015-12-20 MED ORDER — CEPHALEXIN 500 MG PO CAPS: 500.0000 mg | ORAL_CAPSULE | Freq: Four times a day (QID) | ORAL | Status: DC

## 2015-12-20 NOTE — Progress Notes (Signed)
Feeling better.    AF, VSS  + FHT Abd - gravid, NT Mild rt CVAT  A/P:  Pyelonephritis D/c home on PO abx F/u in office next week

## 2015-12-20 NOTE — Discharge Instructions (Signed)

## 2016-01-21 NOTE — Discharge Summary (Addendum)
Physician Discharge Summary  Patient ID: Felicia [name] MRN: [id number] DOB/AGE: 27/10/1990 27 y.o.  Admit date: 12/17/2015 Discharge date: 01/21/2016  Admission Diagnoses: pyelonephritis  Discharge Diagnoses:  Active Problems:   Pyelonephritis affecting pregnancy   Discharged Condition: stable  Hospital Course: PT admitted for IV antibiotics, fluids and inpatient treatment of pyelonephritis.  Symptoms improved and pt discharged home  Consults: None  Significant Diagnostic Studies: labs: cbc, UA  Treatments: IV hydration and antibiotics:   Discharge Exam: Blood pressure 124/61, pulse 77, temperature 97.8 F (36.6 C), temperature source Oral, resp. rate 16, height 5\' 8"  (1.727 m), weight 291 lb 1.6 oz (132.042 kg), last menstrual period 06/13/2015, SpO2 98 %, unknown if currently breastfeeding. Gen - NAD Abd - gravid, NT Mild right CVAT  Disposition: 01-Home or Self Care     Medication List    TAKE these medications        acetaminophen 500 MG tablet  Commonly known as:  TYLENOL  Take 500 mg by mouth daily as needed for mild pain or moderate pain.     cephALEXin 500 MG capsule  Commonly known as:  KEFLEX  Take 1 capsule (500 mg total) by mouth 4 (four) times daily.     prenatal multivitamin Tabs tablet  Take 1 tablet by mouth daily at 12 noon.           Follow-up Information    Schedule an appointment as soon as possible for a visit in 2 weeks to follow up.      Signed: Auburn Hester 01/21/2016, 8:44 AM

## 2016-01-23 ENCOUNTER — Inpatient Hospital Stay (HOSPITAL_COMMUNITY)
Admission: AD | Admit: 2016-01-23 | Discharge: 2016-01-23 | Disposition: A | Discharge status: Home / Self Care | Payer: Managed Care, Other (non HMO) | Source: Ambulatory Visit | Attending: Obstetrics and Gynecology | Admitting: Obstetrics and Gynecology

## 2016-01-23 ENCOUNTER — Encounter (HOSPITAL_COMMUNITY): Payer: Self-pay | Admitting: *Deleted

## 2016-01-23 DIAGNOSIS — Z043 Encounter for examination and observation following other accident: Secondary | ICD-10-CM | POA: Insufficient documentation

## 2016-01-23 DIAGNOSIS — Z3A32 32 weeks gestation of pregnancy: Secondary | ICD-10-CM | POA: Insufficient documentation

## 2016-01-23 DIAGNOSIS — W108XXA Fall (on) (from) other stairs and steps, initial encounter: Secondary | ICD-10-CM

## 2016-01-23 DIAGNOSIS — O26893 Other specified pregnancy related conditions, third trimester: Secondary | ICD-10-CM | POA: Insufficient documentation

## 2016-01-23 LAB — URINALYSIS, ROUTINE W REFLEX MICROSCOPIC
Bilirubin Urine: NEGATIVE
GLUCOSE, UA: NEGATIVE mg/dL
Hgb urine dipstick: NEGATIVE
KETONES UR: NEGATIVE mg/dL
NITRITE: NEGATIVE
PH: 7.5 (ref 5.0–8.0)
Protein, ur: NEGATIVE mg/dL
SPECIFIC GRAVITY, URINE: 1.01 (ref 1.005–1.030)

## 2016-01-23 LAB — URINE MICROSCOPIC-ADD ON

## 2016-01-23 NOTE — MAU Note (Signed)
Urine in lab 

## 2016-01-23 NOTE — MAU Provider Note (Signed)
History     CSN: [id number]  Arrival date and time: 01/23/16 1449   First Provider Initiated Contact with Patient 01/23/16 1540      Chief Complaint  Patient presents with  . Fall   HPIpt is 6222w0d [id number] who presents to MAU after falling in the rain.  She was carrying her 27 year old and also wearing flip flops and slipped in the rain- she fell on her left knee and then fell again on her abdomen as she was getting up.  Pt denies any abdominal pain, spotting or bleeding.  Pt does admit to right  back pain. Pt has hx of pyelonephritis- no sx now.  Rn note;  Expand All Collapse All   Slipped in the rain, landed on knee, slipped again getting up and landed on stomach.. Not feeling baby move       History reviewed. No pertinent past medical history.  Past Surgical History  Procedure Laterality Date  . Cholecystectomy  2011  . Dilation and evacuation N/A 03/07/2015    Procedure: DILATATION AND EVACUATION;  Surgeon: Levi AlandMark E Anderson, MD;  Location: [facility];  Service: Gynecology;  Laterality: N/A;    Family History  Problem Relation Age of Onset  . Diabetes Mother   . Hypertension Father     Social History  Substance Use Topics  . Smoking status: Never Smoker   . Smokeless tobacco: Never Used  . Alcohol Use: No    Allergies: No Known Allergies  Prescriptions prior to admission  Medication Sig Dispense Refill Last Dose  . acetaminophen (TYLENOL) 500 MG tablet Take 500 mg by mouth daily as needed for mild pain or moderate pain.   prn  . cephALEXin (KEFLEX) 500 MG capsule Take 1 capsule (500 mg total) by mouth 4 (four) times daily. 20 capsule 0   . Prenatal Vit-Fe Fumarate-FA (PRENATAL MULTIVITAMIN) TABS tablet Take 1 tablet by mouth daily at 12 noon.    12/16/2015 at Unknown time    Review of Systems  Constitutional: Negative for fever and chills.  Gastrointestinal: Negative for nausea, vomiting, abdominal pain, diarrhea and constipation.  Genitourinary: Negative for dysuria.   Neurological: Negative for headaches.   Physical Exam   Blood pressure 141/79, pulse 99, temperature 98.4 F (36.9 C), temperature source Oral, resp. rate 18, last menstrual period 06/13/2015, unknown if currently breastfeeding.  Physical Exam  Nursing note and vitals reviewed. Constitutional: She is oriented to person, place, and time. She appears well-developed and well-nourished. No distress.  HENT:  Head: Normocephalic.  Eyes: Pupils are equal, round, and reactive to light.  Neck: Normal range of motion. Neck supple.  GI: Soft. She exhibits no distension. There is no tenderness. There is no rebound.  No bruising, redness or tenderness noted; uterine tone: relaxed; reactive FHT  Musculoskeletal: Normal range of motion.  Neurological: She is alert and oriented to person, place, and time.  Skin: Skin is warm and dry.  Psychiatric: She has a normal mood and affect.    MAU Course  Procedures Results for orders placed or performed during the hospital encounter of 01/23/16 (from the past 24 hour(s))  Urinalysis, Routine w reflex microscopic (not [facility])     Status: Abnormal   Collection Time: 01/23/16  3:25 PM  Result Value Ref Range   Color, Urine YELLOW YELLOW   APPearance CLEAR CLEAR   Specific Gravity, Urine 1.010 1.005 - 1.030   pH 7.5 5.0 - 8.0   Glucose, UA NEGATIVE NEGATIVE mg/dL  Hgb urine dipstick NEGATIVE NEGATIVE   Bilirubin Urine NEGATIVE NEGATIVE   Ketones, ur NEGATIVE NEGATIVE mg/dL   Protein, ur NEGATIVE NEGATIVE mg/dL   Nitrite NEGATIVE NEGATIVE   Leukocytes, UA MODERATE (A) NEGATIVE  Urine microscopic-add on     Status: Abnormal   Collection Time: 01/23/16  3:25 PM  Result Value Ref Range   Squamous Epithelial / LPF 0-5 (A) NONE SEEN   WBC, UA 0-5 0 - 5 WBC/hpf   RBC / HPF 0-5 0 - 5 RBC/hpf   Bacteria, UA RARE (A) NONE SEEN  discussed with Dr. Henderson Cloud No UC; FHR 140bpm, with 6-25 bpm variability with 10x10 and 15x15 bpm accelerations noted Monitored  for 4 hours  Assessment and Plan  Fall in pregnancy- third trimester 4 hours monitoring- no ctx; reactive FHR D/c home- kick counts Keep OB appointment in 2 weeks Walk carefully:)  LINEBERRY,SUSAN 01/23/2016, 3:40 PM

## 2016-01-23 NOTE — MAU Note (Addendum)
Slipped in the rain, landed on knee, slipped again getting up and landed on stomach.. Not feeling baby move

## 2016-01-24 ENCOUNTER — Encounter (HOSPITAL_COMMUNITY): Payer: Self-pay

## 2016-01-24 ENCOUNTER — Inpatient Hospital Stay (HOSPITAL_COMMUNITY): Payer: Managed Care, Other (non HMO)

## 2016-01-24 ENCOUNTER — Inpatient Hospital Stay (HOSPITAL_COMMUNITY)
Admission: AD | Admit: 2016-01-24 | Discharge: 2016-01-24 | Disposition: A | Discharge status: Home / Self Care | Payer: Managed Care, Other (non HMO) | Source: Ambulatory Visit | Attending: Obstetrics and Gynecology | Admitting: Obstetrics and Gynecology

## 2016-01-24 DIAGNOSIS — N939 Abnormal uterine and vaginal bleeding, unspecified: Secondary | ICD-10-CM | POA: Diagnosis present

## 2016-01-24 DIAGNOSIS — Z3A32 32 weeks gestation of pregnancy: Secondary | ICD-10-CM | POA: Diagnosis not present

## 2016-01-24 DIAGNOSIS — O4693 Antepartum hemorrhage, unspecified, third trimester: Secondary | ICD-10-CM

## 2016-01-24 DIAGNOSIS — W19XXXA Unspecified fall, initial encounter: Secondary | ICD-10-CM

## 2016-01-24 DIAGNOSIS — Z8249 Family history of ischemic heart disease and other diseases of the circulatory system: Secondary | ICD-10-CM | POA: Insufficient documentation

## 2016-01-24 DIAGNOSIS — Z833 Family history of diabetes mellitus: Secondary | ICD-10-CM | POA: Diagnosis not present

## 2016-01-24 DIAGNOSIS — R58 Hemorrhage, not elsewhere classified: Secondary | ICD-10-CM

## 2016-01-24 LAB — URINALYSIS, ROUTINE W REFLEX MICROSCOPIC
Bilirubin Urine: NEGATIVE
GLUCOSE, UA: NEGATIVE mg/dL
Hgb urine dipstick: NEGATIVE
Ketones, ur: NEGATIVE mg/dL
Nitrite: NEGATIVE
PH: 5.5 (ref 5.0–8.0)
PROTEIN: NEGATIVE mg/dL

## 2016-01-24 LAB — URINE MICROSCOPIC-ADD ON

## 2016-01-24 NOTE — Discharge Instructions (Signed)
Vaginal Bleeding During Pregnancy, Third Trimester ° A small amount of bleeding (spotting) from the vagina is common in pregnancy. Sometimes the bleeding is normal and is not a problem, and sometimes it is a sign of something serious. Be sure to tell your doctor about any bleeding from your vagina right away. °HOME CARE °· Watch your condition for any changes. °· Follow your doctor's instructions about how active you can be. °· If you are on bed rest: °¨ You may need to stay in bed and only get up to use the bathroom. °¨ You may be allowed to do some activities. °¨ If you need help, make plans for someone to help you. °· Write down: °¨ The number of pads you use each day. °¨ How often you change pads. °¨ How soaked (saturated) your pads are. °· Do not use tampons. °· Do not douche. °· Do not have sex or orgasms until your doctor says it is okay. °· Follow your doctor's advice about lifting, driving, and doing physical activities. °· If you pass any tissue from your vagina, save the tissue so you can show it to your doctor. °· Only take medicines as told by your doctor. °· Do not take aspirin because it can make you bleed. °· Keep all follow-up visits as told by your doctor. °GET HELP IF:  °· You bleed from your vagina. °· You have cramps. °· You have labor pains. °· You have a fever that does not go away after you take medicine. °GET HELP RIGHT AWAY IF: °· You have very bad cramps in your back or belly (abdomen). °· You have chills. °· You have a gush of fluid from your vagina. °· You pass large clots or tissue from your vagina. °· You bleed more. °· You feel light-headed or weak. °· You pass out (faint). °· You do not feel your baby move around as much as before. °MAKE SURE YOU: °· Understand these instructions. °· Will watch your condition. °· Will get help right away if you are not doing well or get worse. °  °This information is not intended to replace advice given to you by your health care provider. Make sure  you discuss any questions you have with your health care provider. °  °Document Released: 01/03/2014 Document Reviewed: 01/03/2014 °Elsevier Interactive Patient Education ©2016 [employer] ° °

## 2016-01-24 NOTE — MAU Note (Signed)
Had a fall yesterday and was monitored here.  Had bleeding and cramping today, office told her to call and get checked at MAU.

## 2016-01-24 NOTE — MAU Provider Note (Signed)
  History     CSN: [id number]  Arrival date and time: 01/24/16 1648   First Provider Initiated Contact with Patient 01/24/16 1739      Chief Complaint  Patient presents with  . Vaginal Bleeding   HPI  Felicia [name]  25 y.o. [id number] @ 6826w1d presents to the MAU stating that she had a large amount of bleeding earlier today but now she states she is hardly bleeding at all. Denies pain, LOF, contractions.  History reviewed. No pertinent past medical history.  Past Surgical History  Procedure Laterality Date  . Cholecystectomy  2011  . Dilation and evacuation N/A 03/07/2015    Procedure: DILATATION AND EVACUATION;  Surgeon: Levi AlandMark E Anderson, MD;  Location: [facility];  Service: Gynecology;  Laterality: N/A;    Family History  Problem Relation Age of Onset  . Diabetes Mother   . Hypertension Father     Social History  Substance Use Topics  . Smoking status: Never Smoker   . Smokeless tobacco: Never Used  . Alcohol Use: No    Allergies: No Known Allergies  Prescriptions prior to admission  Medication Sig Dispense Refill Last Dose  . acetaminophen (TYLENOL) 500 MG tablet Take 500 mg by mouth daily as needed for mild pain or moderate pain.   Past Week at Unknown time  . Prenatal Vit-Fe Fumarate-FA (PRENATAL MULTIVITAMIN) TABS tablet Take 1 tablet by mouth at bedtime.    01/23/2016 at Unknown time    Review of Systems  Constitutional: Negative for fever.  Genitourinary:       Vaginal bleeding  All other systems reviewed and are negative.  Physical Exam   Blood pressure 113/67, pulse 97, temperature 97.9 F (36.6 C), temperature source Oral, resp. rate 16, height 5\' 9"  (1.753 m), weight 291 lb (131.997 kg), last menstrual period 06/13/2015, unknown if currently breastfeeding.  Physical Exam  Nursing note and vitals reviewed. Constitutional: She is oriented to person, place, and time. She appears well-developed and well-nourished. No distress.  HENT:  Head: Normocephalic and  atraumatic.  Cardiovascular: Normal rate.   Respiratory: Effort normal. No respiratory distress.  Genitourinary: Vagina normal. No vaginal discharge found.  No bleeding noted on exam  Neurological: She is alert and oriented to person, place, and time. She has normal reflexes.  Skin: Skin is warm and dry.  Psychiatric: She has a normal mood and affect. Her behavior is normal. Judgment and thought content normal.   MAU Course  Procedures  MDM Pt is difficult to monitor to size; Bpp 8/10; no evidence of abruption; preliminary report.Spec exam shows no blood. Fhr category 1 , no contractions. Spoke with Dr. Vincente PoliGrewal and pt will be discharged to home  Assessment and Plan   Vaginal Bleeding in Pregnancy  Discharge Felicia [name] 01/24/2016, 8:13 PM

## 2016-02-20 ENCOUNTER — Encounter (HOSPITAL_COMMUNITY): Payer: Self-pay | Admitting: *Deleted

## 2016-02-20 ENCOUNTER — Inpatient Hospital Stay (HOSPITAL_COMMUNITY)
Admission: AD | Admit: 2016-02-20 | Discharge: 2016-02-20 | Disposition: A | Discharge status: Home / Self Care | Payer: Managed Care, Other (non HMO) | Source: Ambulatory Visit | Attending: Obstetrics and Gynecology | Admitting: Obstetrics and Gynecology

## 2016-02-20 ENCOUNTER — Inpatient Hospital Stay (HOSPITAL_COMMUNITY): Payer: Managed Care, Other (non HMO)

## 2016-02-20 DIAGNOSIS — O4693 Antepartum hemorrhage, unspecified, third trimester: Secondary | ICD-10-CM | POA: Diagnosis not present

## 2016-02-20 DIAGNOSIS — Z3A36 36 weeks gestation of pregnancy: Secondary | ICD-10-CM | POA: Diagnosis not present

## 2016-02-20 LAB — LACTATE DEHYDROGENASE: LDH: 135 U/L (ref 98–192)

## 2016-02-20 LAB — COMPREHENSIVE METABOLIC PANEL
ALBUMIN: 2.6 g/dL — AB (ref 3.5–5.0)
ALT: 10 U/L — AB (ref 14–54)
AST: 14 U/L — AB (ref 15–41)
Alkaline Phosphatase: 124 U/L (ref 38–126)
Anion gap: 6 (ref 5–15)
BILIRUBIN TOTAL: 0.4 mg/dL (ref 0.3–1.2)
CHLORIDE: 107 mmol/L (ref 101–111)
CO2: 23 mmol/L (ref 22–32)
CREATININE: 0.44 mg/dL (ref 0.44–1.00)
Calcium: 8.2 mg/dL — ABNORMAL LOW (ref 8.9–10.3)
GFR calc Af Amer: >60 mL/min (ref >60)
GLUCOSE: 94 mg/dL (ref 65–99)
POTASSIUM: 3.7 mmol/L (ref 3.5–5.1)
Sodium: 136 mmol/L (ref 135–145)
TOTAL PROTEIN: 6.7 g/dL (ref 6.5–8.1)

## 2016-02-20 LAB — URINALYSIS, ROUTINE W REFLEX MICROSCOPIC
BILIRUBIN URINE: NEGATIVE
GLUCOSE, UA: NEGATIVE mg/dL
Ketones, ur: 15 mg/dL — AB
Nitrite: NEGATIVE
PH: 6 (ref 5.0–8.0)
Protein, ur: NEGATIVE mg/dL

## 2016-02-20 LAB — PROTEIN / CREATININE RATIO, URINE
Creatinine, Urine: 36 mg/dL
PROTEIN CREATININE RATIO: 0.22 mg/mg{creat} — AB (ref 0.00–0.15)
TOTAL PROTEIN, URINE: 8 mg/dL

## 2016-02-20 LAB — URIC ACID: URIC ACID, SERUM: 3.7 mg/dL (ref 2.3–6.6)

## 2016-02-20 LAB — CBC
HEMATOCRIT: 33.6 % — AB (ref 36.0–46.0)
Hemoglobin: 11.1 g/dL — ABNORMAL LOW (ref 12.0–15.0)
MCH: 28.5 pg (ref 26.0–34.0)
MCHC: 33 g/dL (ref 30.0–36.0)
MCV: 86.2 fL (ref 78.0–100.0)
Platelets: 198 10*3/uL (ref 150–400)
RBC: 3.9 MIL/uL (ref 3.87–5.11)
RDW: 13.9 % (ref 11.5–15.5)
WBC: 12.5 10*3/uL — AB (ref 4.0–10.5)

## 2016-02-20 LAB — URINE MICROSCOPIC-ADD ON

## 2016-02-20 LAB — OB RESULTS CONSOLE GBS: STREP GROUP B AG: NEGATIVE

## 2016-02-20 NOTE — MAU Provider Note (Signed)
Chief Complaint:  Vaginal Bleeding   First Provider Initiated Contact with Patient 02/20/16 2016      HPI: Felicia [name] is a 25 y.o. [id number] at 26w0dwho presents to maternity admissions reporting vaginal bleeding episode this afternoon described as brown blood, enough to soak 1 pad and onto her underwear, then slowing down to brown spotting only. She reports occasional contractions but no regular contractions. She had a cervical exam in the office today and was 2 cm dilated.  She has not tried any treatments.  She reports good fetal movement, denies LOF, vaginal bleeding, vaginal itching/burning, urinary symptoms, h/a, dizziness, n/v, or fever/chills.    HPI  Past Medical History: History reviewed. No pertinent past medical history.  Past obstetric history: OB History  Gravida Para Term Preterm AB SAB TAB Ectopic Multiple Living  # Outcome Date GA Lbr Len/2nd Weight Sex Delivery Anes PTL Lv  4 Current           3 Term 03/24/14 [redacted]w[redacted]d 06:37 / 00:20 9 lb 3 oz (4.167 kg) M Vag-Spont EPI  Y  2 Term 2013 [redacted]w[redacted]d 03:00 9 lb (4.082 kg)  Vag-Spont EPI N Y  1 SAB               Past Surgical History: Past Surgical History  Procedure Laterality Date  . Cholecystectomy  2011  . Dilation and evacuation N/A 03/07/2015    Procedure: DILATATION AND EVACUATION;  Surgeon: Levi Aland, MD;  Location: [facility];  Service: Gynecology;  Laterality: N/A;    Family History: Family History  Problem Relation Age of Onset  . Diabetes Mother   . Hypertension Father     Social History: Social History  Substance Use Topics  . Smoking status: Never Smoker   . Smokeless tobacco: Never Used  . Alcohol Use: No    Allergies: No Known Allergies  Meds:  Prescriptions prior to admission  Medication Sig Dispense Refill Last Dose  . acetaminophen (TYLENOL) 500 MG tablet Take 500 mg by mouth daily as needed for mild pain or moderate pain.   Past Month at Unknown time  . Prenatal Vit-Fe  Fumarate-FA (PRENATAL MULTIVITAMIN) TABS tablet Take 1 tablet by mouth at bedtime.    02/19/2016 at Unknown time    ROS:  Review of Systems   I have reviewed patient's Past Medical Hx, Surgical Hx, Family Hx, Social Hx, medications and allergies.   Physical Exam   Patient Vitals for the past 24 hrs:  BP Temp Temp src Pulse Resp SpO2 Height Weight  02/20/16 2015 121/70 mmHg - - 99 - - - -  02/20/16 2000 117/71 mmHg - - 95 - - - -  02/20/16 1958 117/71 mmHg - - 95 18 - - -  02/20/16 1950 126/69 mmHg - - 92 18 - - -  02/20/16 1909 146/84 mmHg 98.3 F (36.8 C) Oral 98 18 100 %  (1.702 m) (!) 302 lb 12.8 oz (137.349 kg)   Constitutional: Well-developed, well-nourished female in no acute distress.  Cardiovascular: normal rate Respiratory: normal effort GI: Abd soft, non-tender, gravid appropriate for gestational age.  MS: Extremities nontender, no edema, normal ROM Neurologic: Alert and oriented x 4.  GU: Neg CVAT.  PELVIC EXAM: Cervix pink, visually closed, without lesion, scant light brown discharge, vaginal walls and external genitalia normal  Cervix 2/50/-3, posterior, vertex    FHT:  Baseline 145 , moderate variability,  accelerations present, no decelerations Contractions: q 3-15 mins, irregular   Labs: Results for orders placed or performed during the hospital encounter of 02/20/16 (from the past 24 hour(s))  Urinalysis, Routine w reflex microscopic (not at [facility])     Status: Abnormal   Collection Time: 02/20/16  7:16 PM  Result Value Ref Range   Color, Urine YELLOW YELLOW   APPearance CLEAR CLEAR   Specific Gravity, Urine <1.005 (L) 1.005 - 1.030   pH 6.0 5.0 - 8.0   Glucose, UA NEGATIVE NEGATIVE mg/dL   Hgb urine dipstick MODERATE (A) NEGATIVE   Bilirubin Urine NEGATIVE NEGATIVE   Ketones, ur 15 (A) NEGATIVE mg/dL   Protein, ur NEGATIVE NEGATIVE mg/dL   Nitrite NEGATIVE NEGATIVE   Leukocytes, UA TRACE (A) NEGATIVE  Urine microscopic-add on     Status: Abnormal    Collection Time: 02/20/16  7:16 PM  Result Value Ref Range   Squamous Epithelial / LPF 0-5 (A) NONE SEEN   WBC, UA 0-5 0 - 5 WBC/hpf   RBC / HPF 0-5 0 - 5 RBC/hpf   Bacteria, UA RARE (A) NONE SEEN  Protein / creatinine ratio, urine     Status: Abnormal   Collection Time: 02/20/16  7:16 PM  Result Value Ref Range   Creatinine, Urine 36.00 mg/dL   Total Protein, Urine 8 mg/dL   Protein Creatinine Ratio 0.22 (H) 0.00 - 0.15 mg/mg[Cre]  CBC     Status: Abnormal   Collection Time: 02/20/16  8:56 PM  Result Value Ref Range   WBC 12.5 (H) 4.0 - 10.5 K/uL   RBC 3.90 3.87 - 5.11 MIL/uL   Hemoglobin 11.1 (L) 12.0 - 15.0 g/dL   HCT 16.1 (L) 09.6 - 04.5 %   MCV 86.2 78.0 - 100.0 fL   MCH 28.5 26.0 - 34.0 pg   MCHC 33.0 30.0 - 36.0 g/dL   RDW 40.9 81.1 - 91.4 %   Platelets 198 150 - 400 K/uL  Comprehensive metabolic panel     Status: Abnormal   Collection Time: 02/20/16  8:56 PM  Result Value Ref Range   Sodium 136 135 - 145 mmol/L   Potassium 3.7 3.5 - 5.1 mmol/L   Chloride 107 101 - 111 mmol/L   CO2 23 22 - 32 mmol/L   Glucose, Bld 94 65 - 99 mg/dL   BUN <5 (L) 6 - 20 mg/dL   Creatinine, Ser 7.82 0.44 - 1.00 mg/dL   Calcium 8.2 (L) 8.9 - 10.3 mg/dL   Total Protein 6.7 6.5 - 8.1 g/dL   Albumin 2.6 (L) 3.5 - 5.0 g/dL   AST 14 (L) 15 - 41 U/L   ALT 10 (L) 14 - 54 U/L   Alkaline Phosphatase 124 38 - 126 U/L   Total Bilirubin 0.4 0.3 - 1.2 mg/dL   GFR calc non Af Amer >60 >60 mL/min   GFR calc Af Amer >60 >60 mL/min   Anion gap 6 5 - 15  Uric acid     Status: None   Collection Time: 02/20/16  8:56 PM  Result Value Ref Range   Uric Acid, Serum 3.7 2.3 - 6.6 mg/dL  Lactate dehydrogenase     Status: None   Collection Time: 02/20/16  8:56 PM  Result Value Ref Range   LDH 135 98 - 192 U/L   --/--/A POS (04/16 1650)  MDM: Consult Dr Marcelle Overlie.  Limited OB US and Preeclampsia labs.  Report to Vonzella Nipple, PA.   Follow-up  Information    [place] [facility]  [place].   Why:  As scheduled or sooner if symptoms worsen   Contact information:   [address] [place] [address] [phone]        Sharen CounterLisa Leftwich-Kirby Certified Nurse-Midwife 02/20/2016 8:32 PM  MDM US preliminary - placenta without previa or evidence of abruption, AFI 66%tile Labs without evidence of pre-eclampsia, BP remains normal  A: SIUP at 6135w0d Vaginal bleeding in pregnancy, third trimester  P: Discharge home bleeding precautions and pelvic rest discussed Patient advised to follow-up with [facility] as scheduled or sooner PRN Patient may return to MAU as needed or if her condition were to change or worsen  Marny LowensteinJulie N Wenzel, PA-C  02/20/2016 9:59 PM

## 2016-02-20 NOTE — MAU Note (Signed)
Pt c/o brownish discharge/blood that started around 4pm. Had SVE today in office and was told she was 1.5cm. Having some mild cramping and irregular contractions off and on. +FM

## 2016-02-20 NOTE — Discharge Instructions (Signed)
Pelvic Rest Pelvic rest is sometimes recommended for women when:   The placenta is partially or completely covering the opening of the cervix (placenta previa).  There is bleeding between the uterine wall and the amniotic sac in the first trimester (subchorionic hemorrhage).  The cervix begins to open without labor starting (incompetent cervix, cervical insufficiency).  The labor is too early (preterm labor). HOME CARE INSTRUCTIONS  Do not have sexual intercourse, stimulation, or an orgasm.  Do not use tampons, douche, or put anything in the vagina.  Do not lift anything over 10 pounds (4.5 kg).  Avoid strenuous activity or straining your pelvic muscles. SEEK MEDICAL CARE IF:  You have any vaginal bleeding during pregnancy. Treat this as a potential emergency.  You have cramping pain felt low in the stomach (stronger than menstrual cramps).  You notice vaginal discharge (watery, mucus, or bloody).  You have a low, dull backache.  There are regular contractions or uterine tightening. SEEK IMMEDIATE MEDICAL CARE IF: You have vaginal bleeding and have placenta previa.    This information is not intended to replace advice given to you by your health care provider. Make sure you discuss any questions you have with your health care provider.   Document Released: 12/14/2010 Document Revised: 11/11/2011 Document Reviewed: 02/20/2015 Elsevier Interactive Patient Education 2016 [employer]  Vaginal Bleeding During Pregnancy, Third Trimester  A small amount of bleeding (spotting) from the vagina is common in pregnancy. Sometimes the bleeding is normal and is not a problem, and sometimes it is a sign of something serious. Be sure to tell your doctor about any bleeding from your vagina right away. HOME CARE  Watch your condition for any changes.  Follow your doctor's instructions about how active you can be.  If you are on bed rest:  You may need to stay in bed and only get up  to use the bathroom.  You may be allowed to do some activities.  If you need help, make plans for someone to help you.  Write down:  The number of pads you use each day.  How often you change pads.  How soaked (saturated) your pads are.  Do not use tampons.  Do not douche.  Do not have sex or orgasms until your doctor says it is okay.  Follow your doctor's advice about lifting, driving, and doing physical activities.  If you pass any tissue from your vagina, save the tissue so you can show it to your doctor.  Only take medicines as told by your doctor.  Do not take aspirin because it can make you bleed.  Keep all follow-up visits as told by your doctor. GET HELP IF:   You bleed from your vagina.  You have cramps.  You have labor pains.  You have a fever that does not go away after you take medicine. GET HELP RIGHT AWAY IF:  You have very bad cramps in your back or belly (abdomen).  You have chills.  You have a gush of fluid from your vagina.  You pass large clots or tissue from your vagina.  You bleed more.  You feel light-headed or weak.  You pass out (faint).  You do not feel your baby move around as much as before. MAKE SURE YOU:  Understand these instructions.  Will watch your condition.  Will get help right away if you are not doing well or get worse.   This information is not intended to replace advice given to you by  your health care provider. Make sure you discuss any questions you have with your health care provider. °  °Document Released: 01/03/2014 Document Reviewed: 01/03/2014 °Elsevier Interactive Patient Education ©2016 Elsevier Inc. ° °

## 2016-02-20 NOTE — MAU Note (Signed)
Pt. States she started to have cramping today and irregular contractions. Pt. States she had a OB appointment today and was 1.5cm. Denies leaking clear fluid. Pt. States she started to have brown/bloody discharge around 4pm today. Pt. Notes baby is moving well. Next appointment is Friday.

## 2016-03-03 ENCOUNTER — Inpatient Hospital Stay (HOSPITAL_COMMUNITY)
Admission: AD | Admit: 2016-03-03 | Discharge: 2016-03-04 | Disposition: A | Discharge status: Home / Self Care | Payer: Managed Care, Other (non HMO) | Source: Ambulatory Visit | Attending: Obstetrics and Gynecology | Admitting: Obstetrics and Gynecology

## 2016-03-03 DIAGNOSIS — M7989 Other specified soft tissue disorders: Secondary | ICD-10-CM

## 2016-03-03 DIAGNOSIS — Z3A37 37 weeks gestation of pregnancy: Secondary | ICD-10-CM | POA: Insufficient documentation

## 2016-03-03 DIAGNOSIS — R51 Headache: Secondary | ICD-10-CM | POA: Insufficient documentation

## 2016-03-03 DIAGNOSIS — R519 Headache, unspecified: Secondary | ICD-10-CM

## 2016-03-03 DIAGNOSIS — O1203 Gestational edema, third trimester: Secondary | ICD-10-CM | POA: Insufficient documentation

## 2016-03-04 ENCOUNTER — Encounter (HOSPITAL_COMMUNITY): Payer: Self-pay | Admitting: *Deleted

## 2016-03-04 DIAGNOSIS — O9989 Other specified diseases and conditions complicating pregnancy, childbirth and the puerperium: Secondary | ICD-10-CM | POA: Diagnosis not present

## 2016-03-04 DIAGNOSIS — R51 Headache: Secondary | ICD-10-CM | POA: Diagnosis not present

## 2016-03-04 DIAGNOSIS — Z3A37 37 weeks gestation of pregnancy: Secondary | ICD-10-CM | POA: Diagnosis not present

## 2016-03-04 DIAGNOSIS — O1203 Gestational edema, third trimester: Secondary | ICD-10-CM | POA: Diagnosis not present

## 2016-03-04 LAB — COMPREHENSIVE METABOLIC PANEL
ALK PHOS: 140 U/L — AB (ref 38–126)
ALT: 11 U/L — AB (ref 14–54)
AST: 13 U/L — ABNORMAL LOW (ref 15–41)
Albumin: 2.6 g/dL — ABNORMAL LOW (ref 3.5–5.0)
Anion gap: 6 (ref 5–15)
BILIRUBIN TOTAL: 0.4 mg/dL (ref 0.3–1.2)
BUN: 5 mg/dL — ABNORMAL LOW (ref 6–20)
CALCIUM: 8.4 mg/dL — AB (ref 8.9–10.3)
CHLORIDE: 106 mmol/L (ref 101–111)
CO2: 23 mmol/L (ref 22–32)
CREATININE: 0.51 mg/dL (ref 0.44–1.00)
Glucose, Bld: 90 mg/dL (ref 65–99)
Potassium: 3.9 mmol/L (ref 3.5–5.1)
Sodium: 135 mmol/L (ref 135–145)
TOTAL PROTEIN: 6.1 g/dL — AB (ref 6.5–8.1)

## 2016-03-04 LAB — CBC WITH DIFFERENTIAL/PLATELET
Basophils Absolute: 0 10*3/uL (ref 0.0–0.1)
Basophils Relative: 0 %
EOS PCT: 2 %
Eosinophils Absolute: 0.3 10*3/uL (ref 0.0–0.7)
HEMATOCRIT: 31.8 % — AB (ref 36.0–46.0)
Hemoglobin: 10.6 g/dL — ABNORMAL LOW (ref 12.0–15.0)
LYMPHS ABS: 3.3 10*3/uL (ref 0.7–4.0)
LYMPHS PCT: 25 %
MCH: 28.7 pg (ref 26.0–34.0)
MCHC: 33.3 g/dL (ref 30.0–36.0)
MCV: 86.2 fL (ref 78.0–100.0)
Monocytes Absolute: 1.1 10*3/uL — ABNORMAL HIGH (ref 0.1–1.0)
Monocytes Relative: 8 %
Neutro Abs: 8.8 10*3/uL — ABNORMAL HIGH (ref 1.7–7.7)
Neutrophils Relative %: 66 %
PLATELETS: 223 10*3/uL (ref 150–400)
RBC: 3.69 MIL/uL — AB (ref 3.87–5.11)
RDW: 14.4 % (ref 11.5–15.5)
WBC: 13.4 10*3/uL — AB (ref 4.0–10.5)

## 2016-03-04 LAB — PROTEIN / CREATININE RATIO, URINE
CREATININE, URINE: 130 mg/dL
Protein Creatinine Ratio: 0.16 mg/mg{Cre} — ABNORMAL HIGH (ref 0.00–0.15)
Total Protein, Urine: 21 mg/dL

## 2016-03-04 LAB — URINALYSIS, ROUTINE W REFLEX MICROSCOPIC
BILIRUBIN URINE: NEGATIVE
GLUCOSE, UA: NEGATIVE mg/dL
HGB URINE DIPSTICK: NEGATIVE
Ketones, ur: NEGATIVE mg/dL
Leukocytes, UA: NEGATIVE
Nitrite: NEGATIVE
PROTEIN: NEGATIVE mg/dL
Specific Gravity, Urine: 1.02 (ref 1.005–1.030)
pH: 5.5 (ref 5.0–8.0)

## 2016-03-04 LAB — LACTATE DEHYDROGENASE: LDH: 126 U/L (ref 98–192)

## 2016-03-04 LAB — URIC ACID: URIC ACID, SERUM: 3.9 mg/dL (ref 2.3–6.6)

## 2016-03-04 NOTE — Discharge Instructions (Signed)

## 2016-03-04 NOTE — MAU Note (Signed)
Patient presents at [redacted] weeks gestation with c/o irregular contractions; headache since 2000 for which she took extra strength tylenol at 2030 and it has helped some and feet and leg swelling since lunchtime today. Fetus active. Denies bleeding or discharge.

## 2016-03-04 NOTE — MAU Provider Note (Signed)
History     CSN: [id number]  Arrival date and time: 03/03/16 2344   First Provider Initiated Contact with Patient 03/04/16 0038      Chief Complaint  Patient presents with  . Contractions  . Headache  . Leg Swelling   HPI [name] is a 25 y.o. [id number] at [redacted]w[redacted]d who presents to MAU today with complaint of headache and LE edema. The patient states headache started at 2030 last night. She took Tylenol with some relief and rates pain now at 4/10. She states LE edema x weeks, but worse since yesterday. She denies blurred vision, floaters, RUQ abdominal pina today. She has had occasional irregular contractions. She denies vaginal bleeding or LOF. She denies any history of HTN or complications with this or previous pregnancies. She reports good fetal movement.   OB History    Gravida Para Term Preterm AB TAB SAB Ectopic Multiple Living   History reviewed. No pertinent past medical history.  Past Surgical History  Procedure Laterality Date  . Cholecystectomy  2011  . Dilation and evacuation N/A 03/07/2015    Procedure: DILATATION AND EVACUATION;  Surgeon: Levi Aland, MD;  Location: [facility];  Service: Gynecology;  Laterality: N/A;    Family History  Problem Relation Age of Onset  . Diabetes Mother   . Hypertension Father     Social History  Substance Use Topics  . Smoking status: Never Smoker   . Smokeless tobacco: Never Used  . Alcohol Use: No    Allergies: No Known Allergies  Prescriptions prior to admission  Medication Sig Dispense Refill Last Dose  . acetaminophen (TYLENOL) 500 MG tablet Take 500 mg by mouth daily as needed for mild pain or moderate pain.   Past Month at Unknown time  . Prenatal Vit-Fe Fumarate-FA (PRENATAL MULTIVITAMIN) TABS tablet Take 1 tablet by mouth at bedtime.    02/19/2016 at Unknown time    Review of Systems  Constitutional: Negative for fever and malaise/fatigue.  Eyes: Negative for blurred vision.       Neg  - floaters  Cardiovascular: Positive for leg swelling.  Gastrointestinal: Negative for nausea, vomiting, abdominal pain, diarrhea and constipation.  Genitourinary: Negative for dysuria, urgency and frequency.       Neg - vaginal bleeding, LOF  Neurological: Positive for headaches.   Physical Exam   Blood pressure 117/48, pulse 98, temperature 97.8 F (36.6 C), temperature source Oral, resp. rate 20, height  (1.702 m), weight 302 lb 12 oz (137.326 kg), last menstrual period 06/13/2015, unknown if currently breastfeeding.  Physical Exam  Nursing note and vitals reviewed. Constitutional: She is oriented to person, place, and time. She appears well-developed and well-nourished. No distress.  HENT:  Head: Normocephalic and atraumatic.  Cardiovascular: Normal rate.   Respiratory: Effort normal.  GI: Soft. She exhibits no distension and no mass. There is no tenderness. There is no rebound and no guarding.  Musculoskeletal: She exhibits edema (1+ pitting edema to the mid shin, bilaterally).  Neurological: She is alert and oriented to person, place, and time. She has normal reflexes.  No clonus  Skin: Skin is warm and dry. No erythema.  Psychiatric: She has a normal mood and affect.    Results for orders placed or performed during the hospital encounter of 03/03/16 (from the past 24 hour(s))  Urinalysis, Routine w reflex microscopic (not at [facility])  Status: None   Collection Time: 03/04/16 12:04 AM  Result Value Ref Range   Color, Urine YELLOW YELLOW   APPearance CLEAR CLEAR   Specific Gravity, Urine 1.020 1.005 - 1.030   pH 5.5 5.0 - 8.0   Glucose, UA NEGATIVE NEGATIVE mg/dL   Hgb urine dipstick NEGATIVE NEGATIVE   Bilirubin Urine NEGATIVE NEGATIVE   Ketones, ur NEGATIVE NEGATIVE mg/dL   Protein, ur NEGATIVE NEGATIVE mg/dL   Nitrite NEGATIVE NEGATIVE   Leukocytes, UA NEGATIVE NEGATIVE  Protein / creatinine ratio, urine     Status: Abnormal   Collection Time: 03/04/16 12:04 AM   Result Value Ref Range   Creatinine, Urine 130.00 mg/dL   Total Protein, Urine 21 mg/dL   Protein Creatinine Ratio 0.16 (H) 0.00 - 0.15 mg/mg[Cre]  CBC with Differential/Platelet     Status: Abnormal   Collection Time: 03/04/16 12:53 AM  Result Value Ref Range   WBC 13.4 (H) 4.0 - 10.5 K/uL   RBC 3.69 (L) 3.87 - 5.11 MIL/uL   Hemoglobin 10.6 (L) 12.0 - 15.0 g/dL   HCT 78.231.8 (L) 95.636.0 - 21.346.0 %   MCV 86.2 78.0 - 100.0 fL   MCH 28.7 26.0 - 34.0 pg   MCHC 33.3 30.0 - 36.0 g/dL   RDW 08.614.4 57.811.5 - 46.915.5 %   Platelets 223 150 - 400 K/uL   Neutrophils Relative % 66 %   Neutro Abs 8.8 (H) 1.7 - 7.7 K/uL   Lymphocytes Relative 25 %   Lymphs Abs 3.3 0.7 - 4.0 K/uL   Monocytes Relative 8 %   Monocytes Absolute 1.1 (H) 0.1 - 1.0 K/uL   Eosinophils Relative 2 %   Eosinophils Absolute 0.3 0.0 - 0.7 K/uL   Basophils Relative 0 %   Basophils Absolute 0.0 0.0 - 0.1 K/uL  Comprehensive metabolic panel     Status: Abnormal (Preliminary result)   Collection Time: 03/04/16 12:53 AM  Result Value Ref Range   Sodium 135 135 - 145 mmol/L   Potassium 3.9 3.5 - 5.1 mmol/L   Chloride 106 101 - 111 mmol/L   CO2 23 22 - 32 mmol/L   Glucose, Bld 90 65 - 99 mg/dL   BUN PENDING 6 - 20 mg/dL   Creatinine, Ser 6.290.51 0.44 - 1.00 mg/dL   Calcium 8.4 (L) 8.9 - 10.3 mg/dL   Total Protein 6.1 (L) 6.5 - 8.1 g/dL   Albumin 2.6 (L) 3.5 - 5.0 g/dL   AST 13 (L) 15 - 41 U/L   ALT 11 (L) 14 - 54 U/L   Alkaline Phosphatase 140 (H) 38 - 126 U/L   Total Bilirubin 0.4 0.3 - 1.2 mg/dL   GFR calc non Af Amer >60 >60 mL/min   GFR calc Af Amer >60 >60 mL/min   Anion gap 6 5 - 15  Uric acid     Status: None   Collection Time: 03/04/16 12:53 AM  Result Value Ref Range   Uric Acid, Serum 3.9 2.3 - 6.6 mg/dL  Lactate dehydrogenase     Status: None   Collection Time: 03/04/16 12:53 AM  Result Value Ref Range   LDH 126 98 - 192 U/L    Patient Vitals for the past 24 hrs:  BP Temp Temp src Pulse Resp Height Weight  03/04/16  0102 (!) 117/48 mmHg - - 98 - - -  03/04/16 0046 145/69 mmHg - - 93 - - -  03/04/16 0031 132/73 mmHg - - 91 - - -  03/04/16 0024 (!) 154/48 mmHg - - 91 - - -  03/04/16 0016 (!) 134/50 mmHg 97.8 F (36.6 C) Oral 96 20 5\' 7"  (1.702 m) (!) 302 lb 12 oz (137.326 kg)    Fetal Monitoring: Baseline: 135 bpm Variability: moderate Accelerations: 15 x 15 Decelerations: none Contractions: mild UI  MAU Course  Procedures None  MDM UA, Urine protein/creatinine ratio, CBC, CMP, Uric acid and LDH today  Serial BPs Discussed patient with Dr. Marcelle OverlieHolland. Agrees with plan for discharge at this time with precautions. Follow-up as schedule.  Assessment and Plan  A: SIUP at 1813w6d Headache, mild LE edema  P: Discharge home Labor/pre-eclampsia precautions discussed Patient advised to follow-up with [facility] as scheduled for routine prenatal care or sooner PRN Patient may return to MAU as needed or if her condition were to change or worsen   Marny LowensteinJulie N Wenzel, PA-C  03/04/2016, 1:35 AM

## 2016-03-04 NOTE — MAU Note (Signed)
Urine sent to lab 

## 2016-03-10 ENCOUNTER — Inpatient Hospital Stay (HOSPITAL_COMMUNITY)
Admission: AD | Admit: 2016-03-10 | Discharge: 2016-03-10 | Disposition: A | Discharge status: Home / Self Care | Payer: Managed Care, Other (non HMO) | Source: Ambulatory Visit | Attending: Obstetrics and Gynecology | Admitting: Obstetrics and Gynecology

## 2016-03-10 ENCOUNTER — Encounter (HOSPITAL_COMMUNITY): Payer: Self-pay | Admitting: *Deleted

## 2016-03-10 LAB — URINALYSIS, ROUTINE W REFLEX MICROSCOPIC
BILIRUBIN URINE: NEGATIVE
GLUCOSE, UA: NEGATIVE mg/dL
HGB URINE DIPSTICK: NEGATIVE
KETONES UR: NEGATIVE mg/dL
Nitrite: NEGATIVE
PROTEIN: NEGATIVE mg/dL
Specific Gravity, Urine: 1.01 (ref 1.005–1.030)
pH: 6 (ref 5.0–8.0)

## 2016-03-10 LAB — URINE MICROSCOPIC-ADD ON

## 2016-03-10 NOTE — MAU Note (Signed)
Pt reports she is having sharp shooting pains in her vagina that stared last night. Worse with activity. Having some contractions but not very stron or often, Pain feels diffeent than a contraction. Good fetal movement reported.  Denies vag bleeding or discharge.

## 2016-03-12 ENCOUNTER — Encounter (HOSPITAL_COMMUNITY): Payer: Self-pay | Admitting: *Deleted

## 2016-03-12 ENCOUNTER — Telehealth (HOSPITAL_COMMUNITY): Payer: Self-pay | Admitting: *Deleted

## 2016-03-12 NOTE — H&P (Signed)
Felicia [name] is a 25 y.o. female presenting for IOL. U/S in office 03/12/16 EFW 9#0oz. Cx 3-4/80--2/vtx. History OB History    Gravida Para Term Preterm AB TAB SAB Ectopic Multiple Living   4 2 2  1  1   2      No past medical history on file. Past Surgical History  Procedure Laterality Date  . Cholecystectomy  2011  . Dilation and evacuation N/A 03/07/2015    Procedure: DILATATION AND EVACUATION;  Surgeon: Levi AlandMark E Anderson, MD;  Location: [facility];  Service: Gynecology;  Laterality: N/A;   Family History: family history includes Diabetes in her mother; Hypertension in her father. Social History:  reports that she has never smoked. She has never used smokeless tobacco. She reports that she does not drink alcohol or use illicit drugs.   Prenatal Transfer Tool  Maternal Diabetes: No Genetic Screening: Normal Maternal Ultrasounds/Referrals: Normal Fetal Ultrasounds or other Referrals:  None Maternal Substance Abuse:  No Significant Maternal Medications:  None Significant Maternal Lab Results:  None Other Comments:  None  Review of Systems  Eyes: Negative for blurred vision.  Gastrointestinal: Negative for abdominal pain.  Neurological: Negative for headaches.      Last menstrual period 06/13/2015, unknown if currently breastfeeding. Maternal Exam:  Abdomen: Fetal presentation: vertex     Physical Exam  Cardiovascular: Normal rate.   Respiratory: Effort normal.  GI: Soft.    Prenatal labs: ABO, Rh: --/--/A POS (04/16 1650) Antibody: NEG (04/16 1650) Rubella: Immune, Immune (12/13 0000) RPR: Nonreactive, Nonreactive (12/13 0000)  HBsAg: Negative, Negative (12/13 0000)  HIV: Non-reactive, Non-reactive (12/13 0000)  GBS:     Assessment/Plan: 25 yo G3P2 with EFW 9# and favorable cervix for IOL   Johnnathan Hagemeister II,Evanne Matsunaga E 03/12/2016, 2:21 PM

## 2016-03-12 NOTE — Telephone Encounter (Signed)
Preadmission screen  

## 2016-03-13 ENCOUNTER — Inpatient Hospital Stay (HOSPITAL_COMMUNITY): Payer: Managed Care, Other (non HMO) | Admitting: Anesthesiology

## 2016-03-13 ENCOUNTER — Inpatient Hospital Stay (HOSPITAL_COMMUNITY)
Admission: RE | Admit: 2016-03-13 | Discharge: 2016-03-15 | DRG: 775 | Disposition: A | Discharge status: Home / Self Care | Payer: Managed Care, Other (non HMO) | Source: Ambulatory Visit | Attending: Obstetrics and Gynecology | Admitting: Obstetrics and Gynecology

## 2016-03-13 ENCOUNTER — Encounter (HOSPITAL_COMMUNITY): Payer: Self-pay

## 2016-03-13 DIAGNOSIS — O99214 Obesity complicating childbirth: Secondary | ICD-10-CM | POA: Diagnosis present

## 2016-03-13 DIAGNOSIS — K219 Gastro-esophageal reflux disease without esophagitis: Secondary | ICD-10-CM | POA: Diagnosis present

## 2016-03-13 DIAGNOSIS — Z833 Family history of diabetes mellitus: Secondary | ICD-10-CM | POA: Diagnosis not present

## 2016-03-13 DIAGNOSIS — Z3A39 39 weeks gestation of pregnancy: Secondary | ICD-10-CM

## 2016-03-13 DIAGNOSIS — Z6841 Body Mass Index (BMI) 40.0 and over, adult: Secondary | ICD-10-CM | POA: Diagnosis not present

## 2016-03-13 DIAGNOSIS — O9962 Diseases of the digestive system complicating childbirth: Secondary | ICD-10-CM | POA: Diagnosis present

## 2016-03-13 DIAGNOSIS — Z8249 Family history of ischemic heart disease and other diseases of the circulatory system: Secondary | ICD-10-CM | POA: Diagnosis not present

## 2016-03-13 DIAGNOSIS — IMO0002 Reserved for concepts with insufficient information to code with codable children: Secondary | ICD-10-CM | POA: Diagnosis present

## 2016-03-13 LAB — TYPE AND SCREEN
ABO/RH(D): A POS
ANTIBODY SCREEN: NEGATIVE

## 2016-03-13 LAB — CBC
HCT: 33 % — ABNORMAL LOW (ref 36.0–46.0)
Hemoglobin: 11.1 g/dL — ABNORMAL LOW (ref 12.0–15.0)
MCH: 28.6 pg (ref 26.0–34.0)
MCHC: 33.6 g/dL (ref 30.0–36.0)
MCV: 85.1 fL (ref 78.0–100.0)
PLATELETS: 205 10*3/uL (ref 150–400)
RBC: 3.88 MIL/uL (ref 3.87–5.11)
RDW: 14.4 % (ref 11.5–15.5)
WBC: 14 10*3/uL — ABNORMAL HIGH (ref 4.0–10.5)

## 2016-03-13 LAB — RPR: RPR Ser Ql: NONREACTIVE

## 2016-03-13 MED ORDER — LACTATED RINGERS IV SOLN: 500.0000 mL | INTRAVENOUS | Status: DC | PRN

## 2016-03-13 MED ORDER — ONDANSETRON HCL 4 MG/2ML IJ SOLN: 4.0000 mg | Freq: Four times a day (QID) | INTRAMUSCULAR | Status: DC | PRN

## 2016-03-13 MED ORDER — OXYCODONE HCL 5 MG PO TABS: 5.0000 mg | ORAL_TABLET | ORAL | Status: DC | PRN

## 2016-03-13 MED ORDER — SIMETHICONE 80 MG PO CHEW: 80.0000 mg | CHEWABLE_TABLET | ORAL | Status: DC | PRN

## 2016-03-13 MED ORDER — LIDOCAINE HCL (PF) 1 % IJ SOLN: INTRAMUSCULAR | Status: DC | PRN

## 2016-03-13 MED ORDER — FLEET ENEMA 7-19 GM/118ML RE ENEM: 1.0000 | ENEMA | RECTAL | Status: DC | PRN

## 2016-03-13 MED ORDER — PRENATAL MULTIVITAMIN CH
1.0000 | ORAL_TABLET | Freq: Every day | ORAL | Status: DC
  Administered 2016-03-14 – 2016-03-15 (×2): 1 via ORAL
  Filled 2016-03-13 (×2): qty 1

## 2016-03-13 MED ORDER — ACETAMINOPHEN 325 MG PO TABS: 650.0000 mg | ORAL_TABLET | ORAL | Status: DC | PRN

## 2016-03-13 MED ORDER — OXYTOCIN 40 UNITS IN LACTATED RINGERS INFUSION - SIMPLE MED: 1.0000 m[IU]/min | INTRAVENOUS | Status: DC

## 2016-03-13 MED ORDER — ONDANSETRON HCL 4 MG/2ML IJ SOLN: 4.0000 mg | INTRAMUSCULAR | Status: DC | PRN

## 2016-03-13 MED ORDER — TETANUS-DIPHTH-ACELL PERTUSSIS 5-2.5-18.5 LF-MCG/0.5 IM SUSP: 0.5000 mL | Freq: Once | INTRAMUSCULAR | Status: DC

## 2016-03-13 MED ORDER — PHENYLEPHRINE 40 MCG/ML (10ML) SYRINGE FOR IV PUSH (FOR BLOOD PRESSURE SUPPORT)
80.0000 ug | PREFILLED_SYRINGE | INTRAVENOUS | Status: DC | PRN
  Filled 2016-03-13: qty 5
  Filled 2016-03-13: qty 10

## 2016-03-13 MED ORDER — DIBUCAINE 1 % RE OINT: 1.0000 "application " | TOPICAL_OINTMENT | RECTAL | Status: DC | PRN

## 2016-03-13 MED ORDER — OXYTOCIN 40 UNITS IN LACTATED RINGERS INFUSION - SIMPLE MED
1.0000 m[IU]/min | INTRAVENOUS | Status: DC
  Administered 2016-03-13: 2 m[IU]/min via INTRAVENOUS
  Filled 2016-03-13: qty 1000

## 2016-03-13 MED ORDER — SENNOSIDES-DOCUSATE SODIUM 8.6-50 MG PO TABS
2.0000 | ORAL_TABLET | ORAL | Status: DC
  Administered 2016-03-14 (×2): 2 via ORAL
  Filled 2016-03-13 (×2): qty 2

## 2016-03-13 MED ORDER — ACETAMINOPHEN 325 MG PO TABS
650.0000 mg | ORAL_TABLET | ORAL | Status: DC | PRN
  Administered 2016-03-14 (×2): 650 mg via ORAL
  Filled 2016-03-13 (×2): qty 2

## 2016-03-13 MED ORDER — TERBUTALINE SULFATE 1 MG/ML IJ SOLN
0.2500 mg | Freq: Once | INTRAMUSCULAR | Status: DC | PRN
Start: 2016-03-13 — End: 2016-03-13

## 2016-03-13 MED ORDER — EPHEDRINE 5 MG/ML INJ
10.0000 mg | INTRAVENOUS | Status: DC | PRN
  Filled 2016-03-13: qty 2

## 2016-03-13 MED ORDER — FENTANYL 2.5 MCG/ML BUPIVACAINE 1/10 % EPIDURAL INFUSION (WH - ANES)
14.0000 mL/h | INTRAMUSCULAR | Status: DC | PRN
  Administered 2016-03-13: 14 mL/h via EPIDURAL
  Filled 2016-03-13 (×2): qty 125

## 2016-03-13 MED ORDER — LACTATED RINGERS IV SOLN
500.0000 mL | Freq: Once | INTRAVENOUS | Status: AC
  Administered 2016-03-13: 500 mL via INTRAVENOUS

## 2016-03-13 MED ORDER — WITCH HAZEL-GLYCERIN EX PADS: 1.0000 "application " | MEDICATED_PAD | CUTANEOUS | Status: DC | PRN

## 2016-03-13 MED ORDER — DIPHENHYDRAMINE HCL 50 MG/ML IJ SOLN: 12.5000 mg | INTRAMUSCULAR | Status: DC | PRN

## 2016-03-13 MED ORDER — OXYCODONE-ACETAMINOPHEN 5-325 MG PO TABS: 1.0000 | ORAL_TABLET | ORAL | Status: DC | PRN

## 2016-03-13 MED ORDER — COCONUT OIL OIL: 1.0000 "application " | TOPICAL_OIL | Status: DC | PRN

## 2016-03-13 MED ORDER — PHENYLEPHRINE 40 MCG/ML (10ML) SYRINGE FOR IV PUSH (FOR BLOOD PRESSURE SUPPORT)
80.0000 ug | PREFILLED_SYRINGE | INTRAVENOUS | Status: DC | PRN
  Filled 2016-03-13: qty 5

## 2016-03-13 MED ORDER — OXYTOCIN 40 UNITS IN LACTATED RINGERS INFUSION - SIMPLE MED: 2.5000 [IU]/h | INTRAVENOUS | Status: DC

## 2016-03-13 MED ORDER — LIDOCAINE HCL (PF) 1 % IJ SOLN
INTRAMUSCULAR | Status: DC | PRN
  Administered 2016-03-13 (×2): 4 mL via EPIDURAL

## 2016-03-13 MED ORDER — OXYCODONE HCL 5 MG PO TABS: 10.0000 mg | ORAL_TABLET | ORAL | Status: DC | PRN

## 2016-03-13 MED ORDER — ZOLPIDEM TARTRATE 5 MG PO TABS: 5.0000 mg | ORAL_TABLET | Freq: Every evening | ORAL | Status: DC | PRN

## 2016-03-13 MED ORDER — SOD CITRATE-CITRIC ACID 500-334 MG/5ML PO SOLN: 30.0000 mL | ORAL | Status: DC | PRN

## 2016-03-13 MED ORDER — LACTATED RINGERS IV SOLN
INTRAVENOUS | Status: DC
  Administered 2016-03-13 (×2): via INTRAVENOUS

## 2016-03-13 MED ORDER — LIDOCAINE HCL (PF) 1 % IJ SOLN
30.0000 mL | INTRAMUSCULAR | Status: DC | PRN
  Filled 2016-03-13: qty 30

## 2016-03-13 MED ORDER — DIPHENHYDRAMINE HCL 25 MG PO CAPS: 25.0000 mg | ORAL_CAPSULE | Freq: Four times a day (QID) | ORAL | Status: DC | PRN

## 2016-03-13 MED ORDER — IBUPROFEN 600 MG PO TABS
600.0000 mg | ORAL_TABLET | Freq: Four times a day (QID) | ORAL | Status: DC
  Administered 2016-03-13 – 2016-03-15 (×8): 600 mg via ORAL
  Filled 2016-03-13 (×9): qty 1

## 2016-03-13 MED ORDER — ONDANSETRON HCL 4 MG PO TABS: 4.0000 mg | ORAL_TABLET | ORAL | Status: DC | PRN

## 2016-03-13 MED ORDER — OXYTOCIN BOLUS FROM INFUSION
500.0000 mL | INTRAVENOUS | Status: DC
  Administered 2016-03-13: 500 mL via INTRAVENOUS

## 2016-03-13 MED ORDER — OXYCODONE-ACETAMINOPHEN 5-325 MG PO TABS: 2.0000 | ORAL_TABLET | ORAL | Status: DC | PRN

## 2016-03-13 MED ORDER — BENZOCAINE-MENTHOL 20-0.5 % EX AERO
1.0000 "application " | INHALATION_SPRAY | CUTANEOUS | Status: DC | PRN
  Filled 2016-03-13: qty 56

## 2016-03-13 MED ORDER — TERBUTALINE SULFATE 1 MG/ML IJ SOLN
0.2500 mg | Freq: Once | INTRAMUSCULAR | Status: DC | PRN
  Filled 2016-03-13: qty 1

## 2016-03-13 NOTE — Anesthesia Pain Management Evaluation Note (Signed)
  CRNA Pain Management Visit Note  Patient: Felicia [name], 25 y.o., female  "Hello I am a member of the anesthesia team at [facility]. We have an anesthesia team available at all times to provide care throughout the hospital, including epidural management and anesthesia for C-section. I don't know your plan for the delivery whether it a natural birth, water birth, IV sedation, nitrous supplementation, doula or epidural, but we want to meet your pain goals."   1.Was your pain managed to your expectations on prior hospitalizations?   Yes   2.What is your expectation for pain management during this hospitalization?     Epidural  3.How can we help you reach that goal? epidural  Record the patient's initial score and the patient's pain goal.   Pain: 2  Pain Goal: 4 [facility] wants you to be able to say your pain was always managed very well.  Tywon Niday 03/13/2016

## 2016-03-13 NOTE — Progress Notes (Signed)
No change to H&P Cx 4/50/-3/vtx Attempted AROM-Too posterior FHT cat one Will begin Pitocin

## 2016-03-13 NOTE — Anesthesia Preprocedure Evaluation (Signed)
Anesthesia Evaluation  Patient identified by MRN, date of birth, ID band Patient awake    Reviewed: Allergy & Precautions, NPO status , Patient's Chart, lab work & pertinent test results  Airway Mallampati: III  TM Distance: >3 FB Neck ROM: Full    Dental no notable dental hx. (+) Teeth Intact   Pulmonary neg pulmonary ROS,    Pulmonary exam normal breath sounds clear to auscultation       Cardiovascular negative cardio ROS Normal cardiovascular exam Rhythm:Regular Rate:Normal     Neuro/Psych negative neurological ROS  negative psych ROS   GI/Hepatic Neg liver ROS, GERD  Medicated and Controlled,  Endo/Other  Morbid obesity  Renal/GU Renal diseaseHx/o pyelonephritis during pregnancy Bladder dysfunction   negative genitourinary   Musculoskeletal   Abdominal (+) + obese,   Peds  Hematology  (+) anemia ,   Anesthesia Other Findings   Reproductive/Obstetrics (+) Pregnancy                             Anesthesia Physical Anesthesia Plan  ASA: III  Anesthesia Plan: Epidural   Post-op Pain Management:    Induction:   Airway Management Planned: Natural Airway  Additional Equipment:   Intra-op Plan:   Post-operative Plan:   Informed Consent: I have reviewed the patients History and Physical, chart, labs and discussed the procedure including the risks, benefits and alternatives for the proposed anesthesia with the patient or authorized representative who has indicated his/her understanding and acceptance.     Plan Discussed with: Anesthesiologist, CRNA and Surgeon  Anesthesia Plan Comments:         Anesthesia Quick Evaluation

## 2016-03-13 NOTE — Progress Notes (Signed)
Delivery Note At 3:56 PM a viable female was delivered via Vaginal, Spontaneous Delivery (Presentation: ;  ).  APGAR: 8, 9; weight  .   Placenta status: Intact, Spontaneous.  Cord: 3 vessels with the following complications: None.  Cord pH: pending  Anesthesia: Epidural  Episiotomy: None Lacerations: 1st degree Suture Repair: 2.0 vicryl rapide Est. Blood Loss (mL):    Mom to postpartum.  Baby to Couplet care / Skin to Skin.  Twilla Khouri II,Cherylyn Sundby E 03/13/2016, 4:05 PM

## 2016-03-13 NOTE — Progress Notes (Signed)
FHT cat one UCs q2-3 min Cx 4/70/-2/vtx AROM clear Epidural in

## 2016-03-13 NOTE — Anesthesia Procedure Notes (Addendum)
Epidural Patient location during procedure: OB Start time: 03/13/2016 11:02 AM  Staffing Anesthesiologist: Mal AmabileFOSTER, [place] Want Performed by: anesthesiologist   Preanesthetic Checklist Completed: patient identified, site marked, surgical consent, pre-op evaluation, timeout performed, IV checked, risks and benefits discussed and monitors and equipment checked  Epidural Patient position: sitting Prep: site prepped and draped and DuraPrep Patient monitoring: continuous pulse ox and blood pressure Approach: midline Location: L4-L5 Injection technique: LOR air  Needle:  Needle type: Tuohy  Needle gauge: 17 G Needle length: 9 cm and 9 Needle insertion depth: 5 cm Catheter type: closed end flexible Catheter size: 19 Gauge Catheter at skin depth: 10 cm Test dose: negative and Other  Assessment Events: blood not aspirated, injection not painful, no injection resistance, negative IV test and no paresthesia  Additional Notes Patient identified. Risks and benefits discussed including failed block, incomplete  Pain control, post dural puncture headache, nerve damage, paralysis, blood pressure Changes, nausea, vomiting, reactions to medications-both toxic and allergic and post Partum back pain. All questions were answered. Patient expressed understanding and wished to proceed. Sterile technique was used throughout procedure. Epidural site was Dressed with sterile barrier dressing. No paresthesias, signs of intravascular injection Or signs of intrathecal spread were encountered.  Patient was more comfortable after the epidural was dosed. Please see RN's note for documentation of vital signs and FHR which are stable.

## 2016-03-14 LAB — CBC
HEMATOCRIT: 30.6 % — AB (ref 36.0–46.0)
HEMOGLOBIN: 10.1 g/dL — AB (ref 12.0–15.0)
MCH: 28.2 pg (ref 26.0–34.0)
MCHC: 33 g/dL (ref 30.0–36.0)
MCV: 85.5 fL (ref 78.0–100.0)
Platelets: 211 10*3/uL (ref 150–400)
RBC: 3.58 MIL/uL — AB (ref 3.87–5.11)
RDW: 14.4 % (ref 11.5–15.5)
WBC: 12.5 10*3/uL — AB (ref 4.0–10.5)

## 2016-03-14 NOTE — Progress Notes (Signed)
Patient is doing well. No complaints.  BP 111/66 mmHg  Pulse 70  Temp(Src) 97.8 F (36.6 C) (Oral)  Resp 20  Ht 5\' 7"  (1.702 m)  Wt 296 lb (134.265 kg)  BMI 46.35 kg/m2  SpO2 100%  LMP 06/13/2015  Breastfeeding? Unknown Results for orders placed or performed during the hospital encounter of 03/13/16 (from the past 24 hour(s))  CBC     Status: Abnormal   Collection Time: 03/14/16  5:37 AM  Result Value Ref Range   WBC 12.5 (H) 4.0 - 10.5 K/uL   RBC 3.58 (L) 3.87 - 5.11 MIL/uL   Hemoglobin 10.1 (L) 12.0 - 15.0 g/dL   HCT 16.130.6 (L) 09.636.0 - 04.546.0 %   MCV 85.5 78.0 - 100.0 fL   MCH 28.2 26.0 - 34.0 pg   MCHC 33.0 30.0 - 36.0 g/dL   RDW 40.914.4 81.111.5 - 91.415.5 %   Platelets 211 150 - 400 K/uL   Abdomen is soft and non tender uterus  IMPRESSION: PPD #1 Doing well Routine care Baby has to stay til tomorrow for observation of ?respirations per patient

## 2016-03-14 NOTE — Anesthesia Postprocedure Evaluation (Signed)
Anesthesia Post Note  Patient: Hospital doctorAmber L [name]  Procedure(s) Performed: * No procedures listed *  Patient location during evaluation: Mother Baby Anesthesia Type: Epidural Level of consciousness: awake and alert Pain management: pain level controlled Vital Signs Assessment: post-procedure vital signs reviewed and stable Respiratory status: spontaneous breathing, nonlabored ventilation and respiratory function stable Cardiovascular status: stable Postop Assessment: no headache, no backache, epidural receding and patient able to bend at knees Anesthetic complications: no     Last Vitals:  Filed Vitals:   03/13/16 2230 03/14/16 0546  BP: 110/72 111/66  Pulse: 69 70  Temp: 36.6 C 36.6 C  Resp: 18 20    Last Pain:  Filed Vitals:   03/14/16 0549  PainSc: 2    Pain Goal: Patients Stated Pain Goal: 4 (03/13/16 0735)               Rica RecordsICKELTON,Esty Ahuja

## 2016-03-15 MED ORDER — IBUPROFEN 600 MG PO TABS: 600.0000 mg | ORAL_TABLET | Freq: Four times a day (QID) | ORAL | Status: DC

## 2016-03-15 NOTE — Discharge Summary (Signed)
Obstetric Discharge Summary Reason for Admission: induction of labor Prenatal Procedures: ultrasound Intrapartum Procedures: spontaneous vaginal delivery Postpartum Procedures: none Complications-Operative and Postpartum: 1 degree perineal laceration HEMOGLOBIN  Date Value Ref Range Status  03/14/2016 10.1* 12.0 - 15.0 g/dL Final   HCT  Date Value Ref Range Status  03/14/2016 30.6* 36.0 - 46.0 % Final    Physical Exam:  General: alert and cooperative Lochia: appropriate Uterine Fundus: firm Incision: healing well DVT Evaluation: No evidence of DVT seen on physical exam. Negative Homan's sign. No cords or calf tenderness. No significant calf/ankle edema.  Discharge Diagnoses: Term Pregnancy-delivered  Discharge Information: Date: 03/15/2016 Activity: pelvic rest Diet: routine Medications: PNV and Ibuprofen Condition: stable Instructions: refer to practice specific booklet Discharge to: home   Newborn Data: Live born female  Birth Weight: 8 lb 12.9 oz (3995 g) APGAR: 8, 9  baby in NICU  CURTIS,CAROL G 03/15/2016, 8:41 AM

## 2016-03-15 NOTE — Progress Notes (Signed)
CSW acknowledges NICU admission.   Patient screened out for psychosocial assessment due to none of the following apply:  Psychosocial stressors documented in mother or baby's chart  Gestation less than 32 weeks  Code at delivery   Infant with anomalies  Please contact the Clinical Social Worker if specific needs arise, or by MOB's request.  Giankarlo Leamer LCSW, MSW Clinical Social Work: System Wide Float Coverage for [place] NICU Clinical social worker [phone] 

## 2018-01-13 IMAGING — US US RENAL
1 series · 15 of 25 positions shown · non-contrast
Comparison: None.

CLINICAL DATA: Right-sided flank pain, 26 weeks pregnant

EXAM:
RENAL / URINARY TRACT ULTRASOUND COMPLETE

[Series 1: us renal · 15 of 47 slices shown]
[im 1/47]
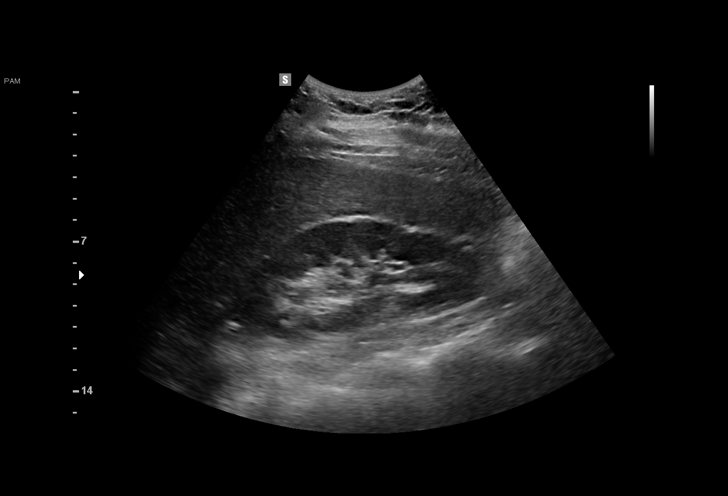
[im 4/47]
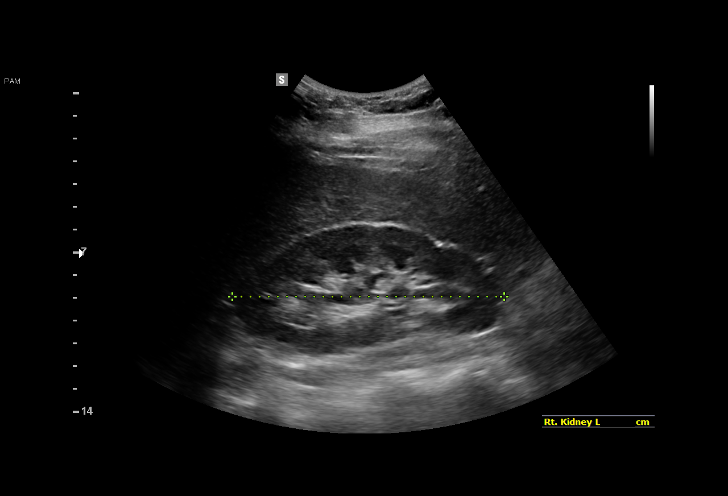
[im 8/47]
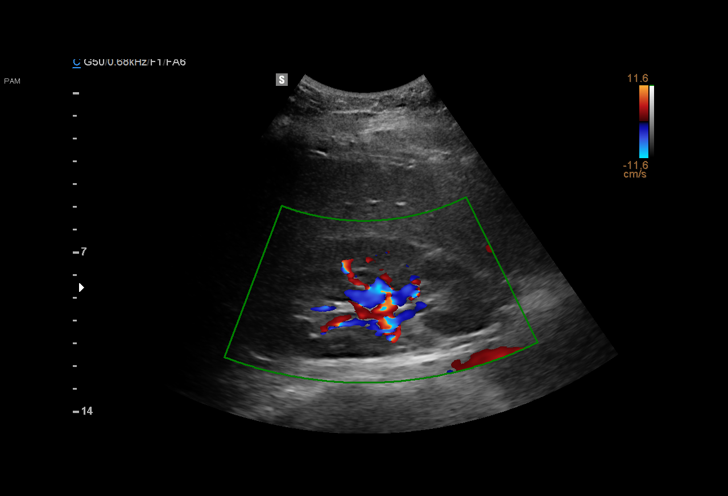
[im 10/47]
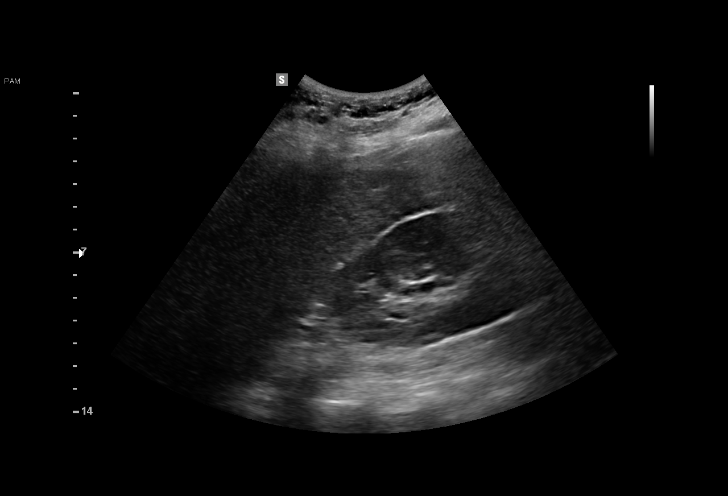
[im 14/47]
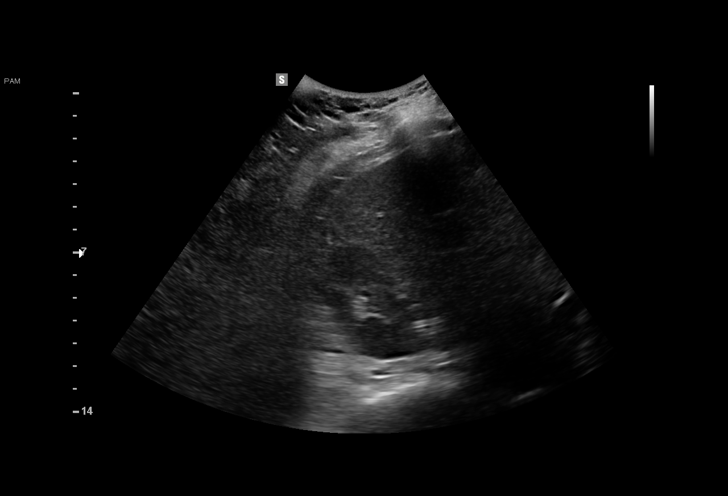
[im 18/47]
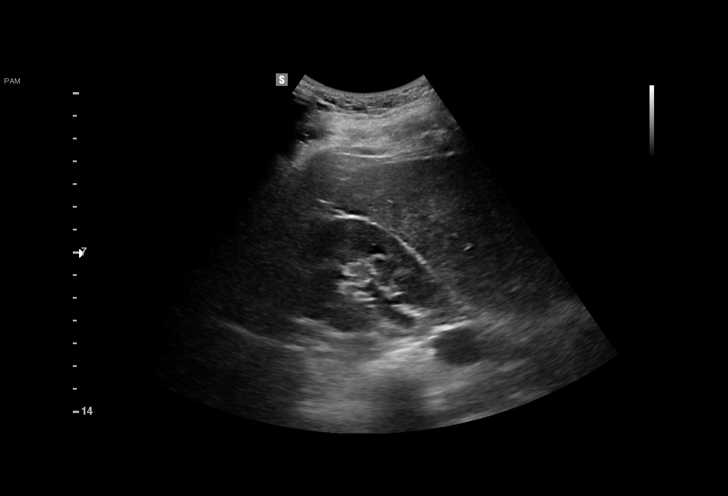
[im 20/47]
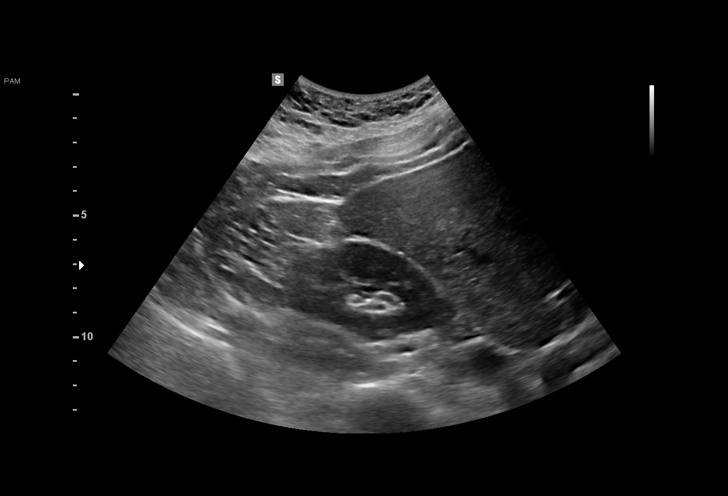
[im 24/47]
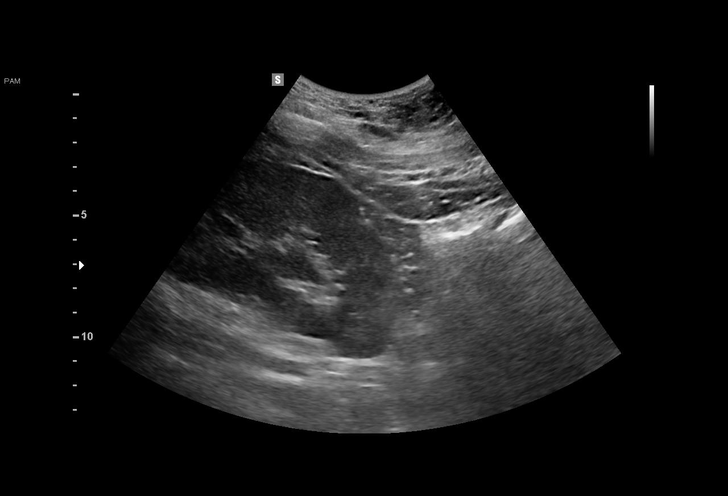
[im 27/47]
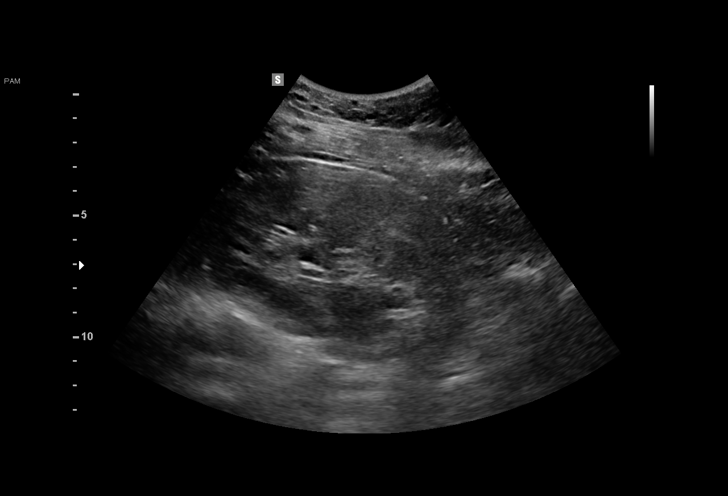
[im 29/47]
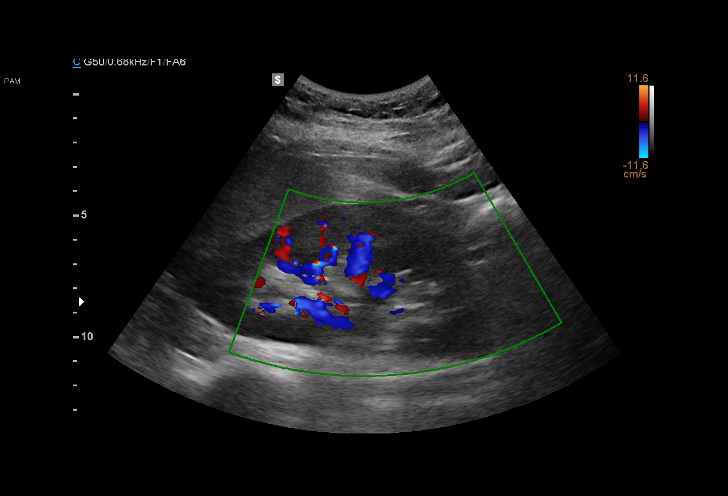
[im 33/47]
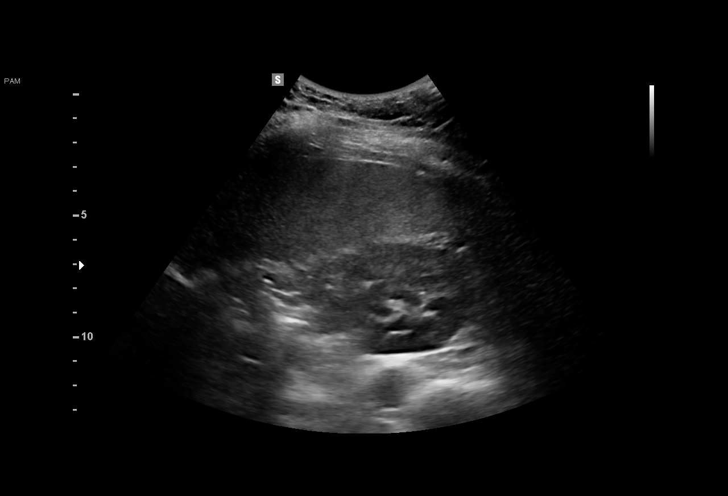
[im 37/47]
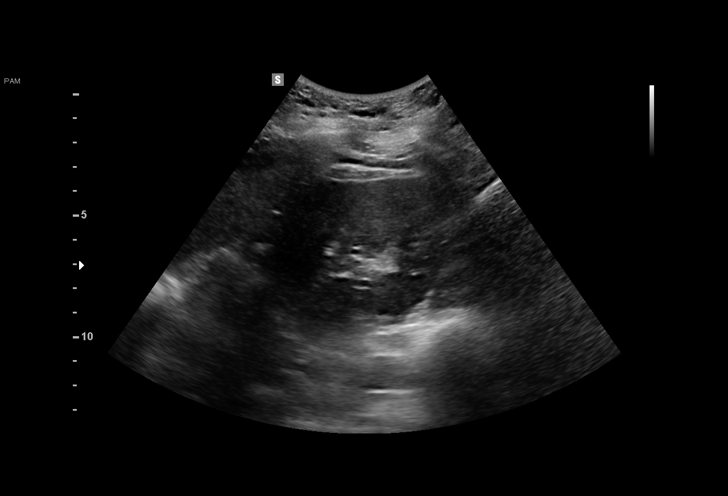
[im 39/47]
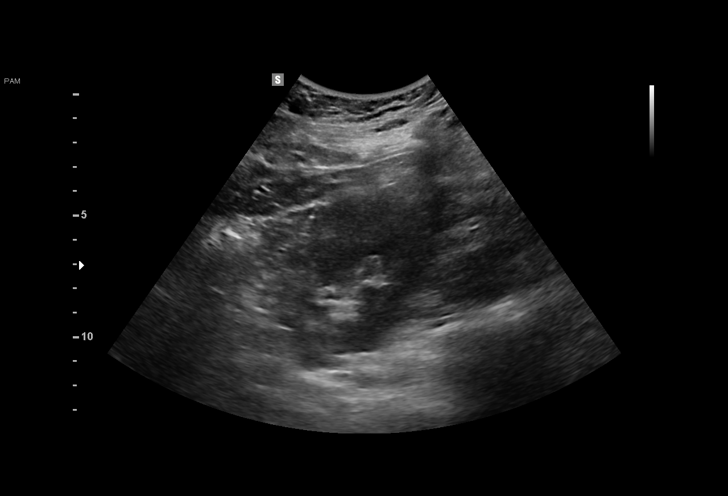
[im 43/47]
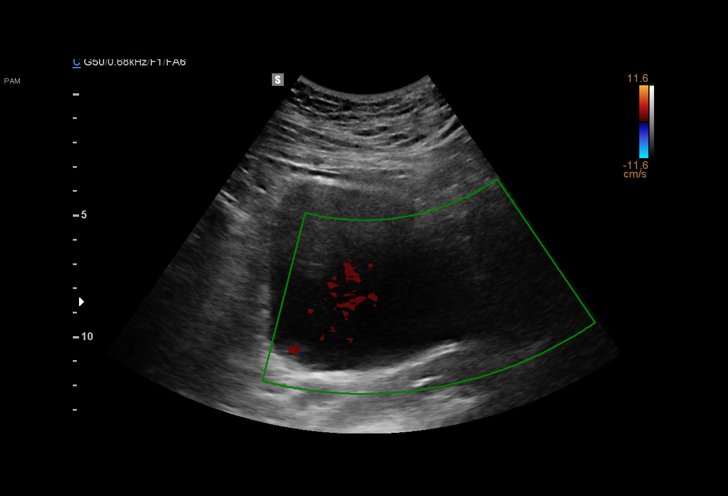
[im 47/47]
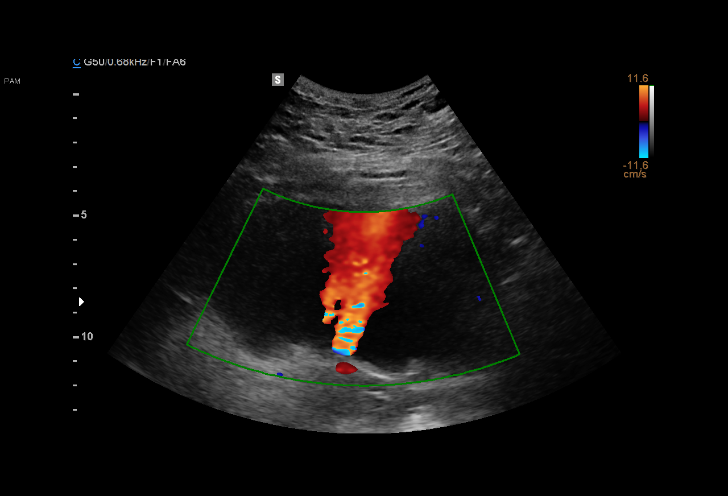

[15 of 25 positions shown; findings below may reference images not displayed]

FINDINGS: Right Kidney:

Length: 11.9 cm.. Minimal fullness of the collecting system is noted
although normal ureteral jet is seen. No true obstruction is noted.

Left Kidney:

Length: 12.1 cm.. Minimal fullness of the collecting system is
noted. No definitive obstruction is seen.

Bladder:

Bilateral ureteral jets are noted. The bladder is well distended. No
acute abnormality is seen.
IMPRESSION: Mild fullness of the collecting systems likely related to
compression from the gravid uterus. No definitive obstruction is
seen.

## 2022-06-11 ENCOUNTER — Other Ambulatory Visit: Payer: Self-pay | Admitting: Obstetrics and Gynecology

## 2022-06-11 DIAGNOSIS — Z363 Encounter for antenatal screening for malformations: Secondary | ICD-10-CM

## 2022-06-11 DIAGNOSIS — O0990 Supervision of high risk pregnancy, unspecified, unspecified trimester: Secondary | ICD-10-CM

## 2022-07-02 ENCOUNTER — Inpatient Hospital Stay (HOSPITAL_COMMUNITY)
Admission: AD | Admit: 2022-07-02 | Discharge: 2022-07-02 | Disposition: A | Discharge status: Home / Self Care | Payer: Managed Care, Other (non HMO) | Attending: Obstetrics and Gynecology | Admitting: Obstetrics and Gynecology

## 2022-07-02 ENCOUNTER — Encounter (HOSPITAL_COMMUNITY): Payer: Self-pay | Admitting: *Deleted

## 2022-07-02 DIAGNOSIS — M545 Low back pain, unspecified: Secondary | ICD-10-CM | POA: Diagnosis not present

## 2022-07-02 DIAGNOSIS — Z3A10 10 weeks gestation of pregnancy: Secondary | ICD-10-CM | POA: Diagnosis not present

## 2022-07-02 DIAGNOSIS — M549 Dorsalgia, unspecified: Secondary | ICD-10-CM

## 2022-07-02 DIAGNOSIS — O26891 Other specified pregnancy related conditions, first trimester: Secondary | ICD-10-CM | POA: Diagnosis present

## 2022-07-02 DIAGNOSIS — O219 Vomiting of pregnancy, unspecified: Secondary | ICD-10-CM | POA: Insufficient documentation

## 2022-07-02 HISTORY — DX: Hidradenitis suppurativa: L73.2

## 2022-07-02 LAB — URINALYSIS, ROUTINE W REFLEX MICROSCOPIC
Bilirubin Urine: NEGATIVE
Glucose, UA: NEGATIVE mg/dL
Hgb urine dipstick: NEGATIVE
Ketones, ur: NEGATIVE mg/dL
Leukocytes,Ua: NEGATIVE
Nitrite: NEGATIVE
Protein, ur: NEGATIVE mg/dL
Specific Gravity, Urine: 1.005 (ref 1.005–1.030)
pH: 8 (ref 5.0–8.0)

## 2022-07-02 LAB — WET PREP, GENITAL
Clue Cells Wet Prep HPF POC: NONE SEEN
Sperm: NONE SEEN
Trich, Wet Prep: NONE SEEN
WBC, Wet Prep HPF POC: <10 (ref <10)
Yeast Wet Prep HPF POC: NONE SEEN

## 2022-07-02 MED ORDER — PROMETHAZINE HCL 12.5 MG PO TABS: 12.5000 mg | ORAL_TABLET | Freq: Four times a day (QID) | ORAL | 0 refills | Status: DC | PRN

## 2022-07-02 NOTE — MAU Note (Signed)
Felicia [name] is a 31 y.o. at Unknown here in MAU reporting: cramping in lower back, vomiting, figures that is normal.   Had a miscarriage 2 pregnancies ago, is between practices. Has been seen at [facility]. Just wanting to make sure all is well. Has been pretty sick, no meds.  No bleeding or d/c. Everything ok with [place]. Onset of complaint: 0300 Pain score: 7, took Tylenol at 0800 Vitals:   07/02/22 0925  BP: 136/77  Pulse: 89  Resp: 16  Temp: 97.8 F (36.6 C)  SpO2: 100%      Lab orders placed from triage:  UA

## 2022-07-02 NOTE — MAU Provider Note (Addendum)
History     CSN: [id number]  Arrival date and time: 07/02/22 0910   Event Date/Time   First Provider Initiated Contact with Patient 07/02/22 1013      Chief Complaint  Patient presents with   Back Pain   Emesis   Felicia [name] is a 31 y.o. [id number] at [redacted]w[redacted]d who receives care at [facility], but is transferring to [place].  She presents today for Back Pain and Emesis. She reports her back pain has been present since about 3 or 4 am and is intermittent cramping that lasts ~ 30 seconds.  Patient states it is located in the lower back on the right side.  She denies a history of back problems or issues with urination.  She states she took tylenol around 0800 with relief.  She reports she has been experiencing nausea and vomiting, but is unable to take Zofran because her youngest son has a genetic disorder.  Patient states she is currently nauseaous and vomited prior to provider arrival at bedside.  Patient denies vaginal concerns.     OB History     Gravida  5   Para  3   Term  3   Preterm      AB  1   Living  3      SAB  1   IAB      Ectopic      Multiple  0   Live Births  3           Past Medical History:  Diagnosis Date   Hidradenitis suppurativa     Past Surgical History:  Procedure Laterality Date   CHOLECYSTECTOMY  2011   DILATION AND EVACUATION N/A 03/07/2015   Procedure: DILATATION AND EVACUATION;  Surgeon: Olga Millers, MD;  Location: [facility];  Service: Gynecology;  Laterality: N/A;    Family History  Problem Relation Age of Onset   Diabetes Mother    Hypertension Father     Social History   Tobacco Use   Smoking status: Never   Smokeless tobacco: Never  Vaping Use   Vaping Use: Never used  Substance Use Topics   Alcohol use: No   Drug use: No    Allergies:  Allergies  Allergen Reactions   Amoxicillin     "It does something to my liver enzymes and my skin turns yellow."    Medications Prior to Admission  Medication  Sig Dispense Refill Last Dose   acetaminophen (TYLENOL) 500 MG tablet Take 500 mg by mouth daily as needed for mild pain or moderate pain.   07/02/2022 at 0800   ibuprofen (ADVIL,MOTRIN) 600 MG tablet Take 1 tablet (600 mg total) by mouth every 6 (six) hours. 30 tablet 1    Prenatal Vit-Fe Fumarate-FA (PRENATAL MULTIVITAMIN) TABS tablet Take 1 tablet by mouth at bedtime.        Review of Systems  Gastrointestinal:  Positive for nausea and vomiting. Negative for abdominal pain.  Genitourinary:  Negative for difficulty urinating, dysuria, vaginal bleeding and vaginal discharge.   Physical Exam   Blood pressure (!) 140/72, pulse 80, temperature 97.8 F (36.6 C), temperature source Oral, resp. rate 16, height 5\' 7"  (1.702 m), weight 127.2 kg, SpO2 99 %, unknown if currently breastfeeding.  Physical Exam Vitals reviewed.  Constitutional:      Appearance: Normal appearance. She is obese. She is not ill-appearing.  HENT:     Head: Normocephalic and atraumatic.  Eyes:     Conjunctiva/sclera:  Conjunctivae normal.  Cardiovascular:     Rate and Rhythm: Normal rate.  Pulmonary:     Effort: Pulmonary effort is normal. No respiratory distress.  Abdominal:     Palpations: Abdomen is soft.     Tenderness: There is no abdominal tenderness.  Musculoskeletal:        General: Normal range of motion.     Cervical back: Normal range of motion.  Skin:    General: Skin is warm and dry.  Neurological:     Mental Status: She is alert and oriented to person, place, and time.  Psychiatric:        Mood and Affect: Mood normal.        Behavior: Behavior normal.     MAU Course  Procedures Results for orders placed or performed during the hospital encounter of 07/02/22 (from the past 24 hour(s))  Wet prep, genital     Status: None   Collection Time: 07/02/22  9:47 AM   Specimen: Vaginal  Result Value Ref Range   Yeast Wet Prep HPF POC NONE SEEN NONE SEEN   Trich, Wet Prep NONE SEEN NONE SEEN    Clue Cells Wet Prep HPF POC NONE SEEN NONE SEEN   WBC, Wet Prep HPF POC <10 <10   Sperm NONE SEEN   Urinalysis, Routine w reflex microscopic Urine, Clean Catch     Status: Abnormal   Collection Time: 07/02/22  9:52 AM  Result Value Ref Range   Color, Urine STRAW (A) YELLOW   APPearance CLEAR CLEAR   Specific Gravity, Urine 1.005 1.005 - 1.030   pH 8.0 5.0 - 8.0   Glucose, UA NEGATIVE NEGATIVE mg/dL   Hgb urine dipstick NEGATIVE NEGATIVE   Bilirubin Urine NEGATIVE NEGATIVE   Ketones, ur NEGATIVE NEGATIVE mg/dL   Protein, ur NEGATIVE NEGATIVE mg/dL   Nitrite NEGATIVE NEGATIVE   Leukocytes,Ua NEGATIVE NEGATIVE   Patient informed that the ultrasound is considered a limited OB ultrasound and is not intended to be a complete ultrasound exam.  Patient also informed that the ultrasound is not being completed with the intent of assessing for fetal or placental anomalies or any pelvic abnormalities.  Explained that the purpose of today's ultrasound is to assess for   patient reassurance and viability.  Patient acknowledges the purpose of the exam and the limitations of the study.  MDM Physical Exam Labs: UA, Wet Prep BSUS  Assessment and Plan  31 year old [id number] at 10.6 weeks Back Pain Nausea/Vomiting  -POC Reviewed -Exam performed -BSUS completed with SIUP with CRL c/w dates, FHR 167. -Patient offered antiemetics-phenergan and/or reglan.  Also discussed ginger ale and crackers. -Patient opts for ginger ale and crackers. -Plan for heating pad for lower back. -Wet prep collected and returns negative. -Will await UA and reassess.   Maryann Conners 07/02/2022, 10:13 AM   Reassessment (10:49 AM) -UA returns negative -Patient reports pain has improved with heat. -Discussed sending phenergan prescription to pharmacy on file. -Encouraged to call primary office or return to MAU if symptoms worsen or with the onset of new symptoms. -Discharged to home in stable condition.  Maryann Conners MSN, CNM Advanced Practice Provider, [facility]

## 2022-07-09 ENCOUNTER — Ambulatory Visit (INDEPENDENT_AMBULATORY_CARE_PROVIDER_SITE_OTHER): Payer: Managed Care, Other (non HMO)

## 2022-07-09 ENCOUNTER — Other Ambulatory Visit (HOSPITAL_COMMUNITY)
Admission: RE | Admit: 2022-07-09 | Discharge: 2022-07-09 | Disposition: A | Discharge status: Home / Self Care | Payer: Managed Care, Other (non HMO) | Source: Ambulatory Visit

## 2022-07-09 VITALS — BP 123/80 | HR 72 | Wt 280.0 lb

## 2022-07-09 DIAGNOSIS — Z3491 Encounter for supervision of normal pregnancy, unspecified, first trimester: Secondary | ICD-10-CM

## 2022-07-09 DIAGNOSIS — Z3A11 11 weeks gestation of pregnancy: Secondary | ICD-10-CM

## 2022-07-09 DIAGNOSIS — O0990 Supervision of high risk pregnancy, unspecified, unspecified trimester: Secondary | ICD-10-CM | POA: Insufficient documentation

## 2022-07-09 DIAGNOSIS — O0991 Supervision of high risk pregnancy, unspecified, first trimester: Secondary | ICD-10-CM

## 2022-07-09 DIAGNOSIS — Z3490 Encounter for supervision of normal pregnancy, unspecified, unspecified trimester: Secondary | ICD-10-CM

## 2022-07-09 DIAGNOSIS — O09291 Supervision of pregnancy with other poor reproductive or obstetric history, first trimester: Secondary | ICD-10-CM

## 2022-07-09 DIAGNOSIS — O09299 Supervision of pregnancy with other poor reproductive or obstetric history, unspecified trimester: Secondary | ICD-10-CM | POA: Insufficient documentation

## 2022-07-09 DIAGNOSIS — O99210 Obesity complicating pregnancy, unspecified trimester: Secondary | ICD-10-CM | POA: Insufficient documentation

## 2022-07-09 DIAGNOSIS — O99211 Obesity complicating pregnancy, first trimester: Secondary | ICD-10-CM

## 2022-07-09 MED ORDER — ASPIRIN 81 MG PO TBEC: 81.0000 mg | DELAYED_RELEASE_TABLET | Freq: Every day | ORAL | 12 refills | Status: DC

## 2022-07-09 NOTE — Progress Notes (Signed)
Subjective:   KALEEAH GINGERICH is a 31 y.o. [id number] at 59w6dby LMP being seen today for her first obstetrical visit.  Her obstetrical history is significant for obesity, previous FT SVD x3 and has History of macrosomia in infant in prior pregnancy, currently pregnant; Obesity affecting pregnancy, antepartum; Supervision of high risk pregnancy, antepartum; and History of fetal anomaly in prior pregnancy, currently pregnant on their problem list.. Patient does not intend to breast feed. Pregnancy history fully reviewed.  Patient reports no complaints.  HISTORY: OB History  Gravida Para Term Preterm AB Living  _0 0 1 3  SAB IAB Ectopic Multiple Live Births  1 0 0 0 3    # Outcome Date GA Lbr Len/2nd Weight Sex Delivery Anes PTL Lv  5 Current           4 Term 03/13/16 331w1d5:09 / 00:17 8 lb 12.9 oz (3.995 kg) M Vag-Spont EPI  LIV     Name: GOINS,BOY Terriona     Apgar1: 8  Apgar5: 9  3 Term 03/24/14 3964w3d:37 / 00:20 9 lb 3 oz (4.167 kg) M Vag-Spont EPI  LIV     Name: GOINS,BOY Bridgett     Apgar1: 9  Apgar5: 9  2 Term 2013 40w83w0d00 9 lb (4.082 kg)  Vag-Spont EPI N LIV  1 SAB            Past Medical History:  Diagnosis Date   Hidradenitis suppurativa    Past Surgical History:  Procedure Laterality Date   CHOLECYSTECTOMY  2011   DILATION AND EVACUATION N/A 03/07/2015   Procedure: DILATATION AND EVACUATION;  Surgeon: MarkOlga Millers;  [facility];  Service: Gynecology;  Laterality: N/A;   Family History  Problem Relation Age of Onset   Diabetes Mother    Hypertension Father    Social History   Tobacco Use   Smoking status: Never   Smokeless tobacco: Never  Vaping Use   Vaping Use: Never used  Substance Use Topics   Alcohol use: No   Drug use: No   Allergies  Allergen Reactions   Amoxicillin     "It does something to my liver enzymes and my skin turns yellow."   Current Outpatient Medications on File Prior to Visit  Medication Sig Dispense Refill    Prenatal Vit-Fe Fumarate-FA (PRENATAL MULTIVITAMIN) TABS tablet Take 1 tablet by mouth at bedtime.      promethazine (PHENERGAN) 12.5 MG tablet Take 1 tablet (12.5 mg total) by mouth every 6 (six) hours as needed for nausea or vomiting. 30 tablet 0   acetaminophen (TYLENOL) 500 MG tablet Take 500 mg by mouth daily as needed for mild pain or moderate pain. (Patient not taking: Reported on 07/09/2022)     metFORMIN (GLUCOPHAGE) 500 MG tablet Take 500 mg by mouth 2 (two) times daily. (Patient not taking: Reported on 07/09/2022)     montelukast (SINGULAIR) 10 MG tablet Take 10 mg by mouth daily. (Patient not taking: Reported on 07/09/2022)     No current facility-administered medications on file prior to visit.   Indications for ASA therapy (per uptodate) One of the following: Previous pregnancy with preeclampsia, especially early onset and with an adverse outcome No Multifetal gestation No Chronic hypertension No Type 1 or 2 diabetes mellitus No Chronic kidney disease No Autoimmune disease (antiphospholipid syndrome, systemic lupus erythematosus) No  Two or more of the following: Nulliparity No Obesity (body mass index >30 kg/m2)  Yes Family history of preeclampsia in mother or sister No Age ?35 years No Sociodemographic characteristics (African American race, low socioeconomic level) No Personal risk factors (eg, previous pregnancy with low birth weight or small for gestational age infant, previous adverse pregnancy outcome [eg, stillbirth], interval >10 years between pregnancies) No  Indications for early 1 hour GTT (per uptodate)  BMI >25 (>23 in Asian women) AND one of the following  Gestational diabetes mellitus in a previous pregnancy No Glycated hemoglobin ?5.7 percent (39 mmol/mol), impaired glucose tolerance, or impaired fasting glucose on previous testing No First-degree relative with diabetes No High-risk race/ethnicity (eg, African American, Latino, Native American, [place]  American, Pacific Islander) No History of cardiovascular disease No Hypertension or on therapy for hypertension No High-density lipoprotein cholesterol level <35 mg/dL (0.90 mmol/L) and/or a triglyceride level >250 mg/dL (2.82 mmol/L) No Polycystic ovary syndrome No Physical inactivity No Other clinical condition associated with insulin resistance (eg, severe obesity, acanthosis nigricans) No Previous birth of an infant weighing ?4000 g Yes Previous stillbirth of unknown cause No  Exam   Vitals:   07/09/22 0947  BP: 123/80  Pulse: 72  Weight: 280 lb (127 kg)      Uterus:     Pelvic Exam: Perineum: no hemorrhoids, normal perineum   Vulva: normal external genitalia, no lesions   Vagina:  normal mucosa, normal discharge   Cervix: no lesions and normal, pap smear done.    Adnexa: normal adnexa and no mass, fullness, tenderness   Bony Pelvis: average  System: General: well-developed, well-nourished female in no acute distress   Breast:  normal appearance, no masses or tenderness   Skin: normal coloration and turgor, no rashes   Neurologic: oriented, normal, negative, normal mood   Extremities: normal strength, tone, and muscle mass, ROM of all joints is normal   HEENT PERRLA, extraocular movement intact and sclera clear, anicteric   Mouth/Teeth mucous membranes moist, pharynx normal without lesions and dental hygiene good   Neck supple and no masses   Cardiovascular: regular rate and rhythm   Respiratory:  no respiratory distress, normal breath sounds   Abdomen: soft, non-tender; bowel sounds normal; no masses,  no organomegaly     Assessment:   Pregnancy: [id number] Patient Active Problem List   Diagnosis Date Noted   History of macrosomia in infant in prior pregnancy, currently pregnant 07/09/2022   Obesity affecting pregnancy, antepartum 07/09/2022   Supervision of high risk pregnancy, antepartum 07/09/2022   History of fetal anomaly in prior pregnancy, currently pregnant  07/09/2022     Plan:  1. Supervision of high risk pregnancy, antepartum - NOB. Patient doing well. Nausea improving. - Welcomed patient to practice.  - Reviewed practice model - Anticipatory guidance for upcoming appointments provided  - Cytology - PAP - Culture, OB Urine - Hepatitis C antibody - HIV Antibody (routine testing w rflx) - Obstetric panel - HgB A1c - US OB Limited; Future   2. [redacted] weeks gestation of pregnancy   3. Obesity affecting pregnancy, antepartum, unspecified obesity type - Start bASA _0  weeks  4. History of macrosomia in infant in prior pregnancy, currently pregnant - Pelvis proven to 4167g, no issues at delivery   5. 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 Continue prenatal vitamins. Discussed and offered genetic screening  options, including Quad screen/AFP, NIPS testing, and option to decline testing. Benefits/risks/alternatives reviewed. Pt aware that anatomy US is form of genetic screening with lower accuracy in detecting trisomies than blood work.  Pt chooses/declines genetic screening today. NIPS: requested. Ultrasound discussed; fetal anatomic survey: requested. Problem list reviewed and updated. The nature of [facility] with multiple MDs and other Advanced Practice Providers was explained to patient; also emphasized that residents, students are part of our team. Routine obstetric precautions reviewed. Return in about 4 weeks (around 08/06/2022).   Renee Harder, CNM 07/09/22 10:31 AM

## 2022-07-10 LAB — OBSTETRIC PANEL
Absolute Monocytes: 391 cells/uL (ref 200–950)
Antibody Screen: NOT DETECTED
Basophils Absolute: 51 cells/uL (ref 0–200)
Basophils Relative: 0.6 %
Eosinophils Absolute: 230 cells/uL (ref 15–500)
Eosinophils Relative: 2.7 %
HCT: 37 % (ref 35.0–45.0)
Hemoglobin: 12.4 g/dL (ref 11.7–15.5)
Hepatitis B Surface Ag: NONREACTIVE
Lymphs Abs: 1802 cells/uL (ref 850–3900)
MCH: 29.5 pg (ref 27.0–33.0)
MCHC: 33.5 g/dL (ref 32.0–36.0)
MCV: 87.9 fL (ref 80.0–100.0)
MPV: 10.6 fL (ref 7.5–12.5)
Monocytes Relative: 4.6 %
Neutro Abs: 6027 cells/uL (ref 1500–7800)
Neutrophils Relative %: 70.9 %
Platelets: 265 10*3/uL (ref 140–400)
RBC: 4.21 10*6/uL (ref 3.80–5.10)
RDW: 13.6 % (ref 11.0–15.0)
RPR Ser Ql: NONREACTIVE
Rubella: 11.6 Index
Total Lymphocyte: 21.2 %
WBC: 8.5 10*3/uL (ref 3.8–10.8)

## 2022-07-10 LAB — HEMOGLOBIN A1C
Hgb A1c MFr Bld: 5.3 % of total Hgb (ref <5.7)
Mean Plasma Glucose: 105 mg/dL
eAG (mmol/L): 5.8 mmol/L

## 2022-07-10 LAB — HEPATITIS C ANTIBODY: Hepatitis C Ab: NONREACTIVE

## 2022-07-10 LAB — HIV ANTIBODY (ROUTINE TESTING W REFLEX): HIV 1&2 Ab, 4th Generation: NONREACTIVE

## 2022-07-11 LAB — CULTURE, OB URINE

## 2022-07-11 LAB — URINE CULTURE, OB REFLEX

## 2022-07-12 LAB — CYTOLOGY - PAP
Chlamydia: NEGATIVE
Comment: NEGATIVE
Comment: NEGATIVE
Comment: NORMAL
Diagnosis: UNDETERMINED — AB
High risk HPV: NEGATIVE
Neisseria Gonorrhea: NEGATIVE

## 2022-07-19 ENCOUNTER — Encounter (HOSPITAL_COMMUNITY): Payer: Self-pay | Admitting: Obstetrics and Gynecology

## 2022-07-19 ENCOUNTER — Inpatient Hospital Stay (HOSPITAL_COMMUNITY)
Admission: AD | Admit: 2022-07-19 | Discharge: 2022-07-19 | Disposition: A | Discharge status: Home / Self Care | Payer: Managed Care, Other (non HMO) | Attending: Obstetrics and Gynecology | Admitting: Obstetrics and Gynecology

## 2022-07-19 DIAGNOSIS — Z1152 Encounter for screening for COVID-19: Secondary | ICD-10-CM | POA: Diagnosis not present

## 2022-07-19 DIAGNOSIS — Z3A13 13 weeks gestation of pregnancy: Secondary | ICD-10-CM | POA: Diagnosis present

## 2022-07-19 DIAGNOSIS — R6889 Other general symptoms and signs: Secondary | ICD-10-CM

## 2022-07-19 DIAGNOSIS — O219 Vomiting of pregnancy, unspecified: Secondary | ICD-10-CM | POA: Insufficient documentation

## 2022-07-19 DIAGNOSIS — O99511 Diseases of the respiratory system complicating pregnancy, first trimester: Secondary | ICD-10-CM | POA: Insufficient documentation

## 2022-07-19 DIAGNOSIS — R051 Acute cough: Secondary | ICD-10-CM | POA: Diagnosis not present

## 2022-07-19 HISTORY — DX: Unspecified asthma, uncomplicated: J45.909

## 2022-07-19 LAB — URINALYSIS, ROUTINE W REFLEX MICROSCOPIC
Bilirubin Urine: NEGATIVE
Glucose, UA: NEGATIVE mg/dL
Hgb urine dipstick: NEGATIVE
Ketones, ur: NEGATIVE mg/dL
Nitrite: NEGATIVE
Protein, ur: NEGATIVE mg/dL
Specific Gravity, Urine: 1.008 (ref 1.005–1.030)
pH: 8 (ref 5.0–8.0)

## 2022-07-19 LAB — RESP PANEL BY RT-PCR (FLU A&B, COVID) ARPGX2
Influenza A by PCR: NEGATIVE
Influenza B by PCR: NEGATIVE
SARS Coronavirus 2 by RT PCR: NEGATIVE

## 2022-07-19 MED ORDER — FLUTICASONE PROPIONATE HFA 44 MCG/ACT IN AERO: 2.0000 | INHALATION_SPRAY | Freq: Two times a day (BID) | RESPIRATORY_TRACT | 2 refills | Status: AC

## 2022-07-19 MED ORDER — GUAIFENESIN-CODEINE 100-10 MG/5ML PO SOLN
10.0000 mL | Freq: Once | ORAL | Status: AC
  Administered 2022-07-19: 10 mL via ORAL
  Filled 2022-07-19: qty 10

## 2022-07-19 MED ORDER — BENZONATATE 200 MG PO CAPS: 200.0000 mg | ORAL_CAPSULE | Freq: Three times a day (TID) | ORAL | 0 refills | Status: DC | PRN

## 2022-07-19 NOTE — MAU Provider Note (Signed)
History     CSN: [id number]  Arrival date and time: 07/19/22 0307   Event Date/Time   First Provider Initiated Contact with Patient 07/19/22 0344      Chief Complaint  Patient presents with   Shortness of Breath   Nausea   Fever   HPI  [name] is a 31 y.o.female [id number] @ [redacted]w[redacted]d here with URI symptoms. Reports she started coughing yesterday. Reports some wheezing and has been using her inhaler. She does not have asthma, however has asthma like symptoms since having Covid a while back. Reports some relief with inhaler immediately after using. She reports drainage and some vomiting post cough. She has not taken anything for the symptoms. She is drinking fluid well. Some decreased appetite.    OB History     Gravida  5   Para  3   Term  3   Preterm      AB  1   Living  3      SAB  1   IAB      Ectopic      Multiple  0   Live Births  3           Past Medical History:  Diagnosis Date   Asthma    Hidradenitis suppurativa     Past Surgical History:  Procedure Laterality Date   CHOLECYSTECTOMY  2011   DILATION AND EVACUATION N/A 03/07/2015   Procedure: DILATATION AND EVACUATION;  Surgeon: Levi Aland, MD;  Location: [facility];  Service: Gynecology;  Laterality: N/A;    Family History  Problem Relation Age of Onset   Diabetes Mother    Hypertension Father     Social History   Tobacco Use   Smoking status: Never   Smokeless tobacco: Never  Vaping Use   Vaping Use: Never used  Substance Use Topics   Alcohol use: No   Drug use: No    Allergies:  Allergies  Allergen Reactions   Amoxicillin     "It does something to my liver enzymes and my skin turns yellow."    Medications Prior to Admission  Medication Sig Dispense Refill Last Dose   aspirin EC 81 MG tablet Take 1 tablet (81 mg total) by mouth daily. Swallow whole. 30 tablet 12 07/18/2022   Prenatal Vit-Fe Fumarate-FA (PRENATAL MULTIVITAMIN) TABS tablet Take 1 tablet by mouth  at bedtime.    07/18/2022   promethazine (PHENERGAN) 12.5 MG tablet Take 1 tablet (12.5 mg total) by mouth every 6 (six) hours as needed for nausea or vomiting. 30 tablet 0 Past Week   acetaminophen (TYLENOL) 500 MG tablet Take 500 mg by mouth daily as needed for mild pain or moderate pain. (Patient not taking: Reported on 07/09/2022)      metFORMIN (GLUCOPHAGE) 500 MG tablet Take 500 mg by mouth 2 (two) times daily. (Patient not taking: Reported on 07/09/2022)      montelukast (SINGULAIR) 10 MG tablet Take 10 mg by mouth daily. (Patient not taking: Reported on 07/09/2022)      Results for orders placed or performed during the hospital encounter of 07/19/22 (from the past 48 hour(s))  Urinalysis, Routine w reflex microscopic Anterior Nasal Swab     Status: Abnormal   Collection Time: 07/19/22  3:20 AM  Result Value Ref Range   Color, Urine YELLOW YELLOW   APPearance HAZY (A) CLEAR   Specific Gravity, Urine 1.008 1.005 - 1.030   pH 8.0 5.0 - 8.0  Glucose, UA NEGATIVE NEGATIVE mg/dL   Hgb urine dipstick NEGATIVE NEGATIVE   Bilirubin Urine NEGATIVE NEGATIVE   Ketones, ur NEGATIVE NEGATIVE mg/dL   Protein, ur NEGATIVE NEGATIVE mg/dL   Nitrite NEGATIVE NEGATIVE   Leukocytes,Ua MODERATE (A) NEGATIVE   RBC / HPF 0-5 0 - 5 RBC/hpf   WBC, UA 6-10 0 - 5 WBC/hpf   Bacteria, UA FEW (A) NONE SEEN   Squamous Epithelial / LPF 6-10 0 - 5   Mucus PRESENT     Comment: Performed at [facility], [address], [place], [place] [address]  Resp Panel by RT-PCR (Flu A&B, Covid) Anterior Nasal Swab     Status: None   Collection Time: 07/19/22  3:35 AM   Specimen: Anterior Nasal Swab  Result Value Ref Range   SARS Coronavirus 2 by RT PCR NEGATIVE NEGATIVE    Comment: (NOTE) SARS-CoV-2 target nucleic acids are NOT DETECTED.  The SARS-CoV-2 RNA is generally detectable in upper respiratory specimens during the acute phase of infection. The lowest concentration of SARS-CoV-2 viral copies this assay can  detect is 138 copies/mL. A negative result does not preclude SARS-Cov-2 infection and should not be used as the sole basis for treatment or other patient management decisions. A negative result may occur with  improper specimen collection/handling, submission of specimen other than nasopharyngeal swab, presence of viral mutation(s) within the areas targeted by this assay, and inadequate number of viral copies(<138 copies/mL). A negative result must be combined with clinical observations, patient history, and epidemiological information. The expected result is Negative.  Fact Sheet for Patients:  BloggerCourse.com  Fact Sheet for Healthcare Providers:  SeriousBroker.it  This test is no t yet approved or cleared by the [place] FDA and  has been authorized for detection and/or diagnosis of SARS-CoV-2 by FDA under an Emergency Use Authorization (EUA). This EUA will remain  in effect (meaning this test can be used) for the duration of the COVID-19 declaration under Section 564(b)(1) of the Act, 21 U.S.C.section 360bbb-3(b)(1), unless the authorization is terminated  or revoked sooner.       Influenza A by PCR NEGATIVE NEGATIVE   Influenza B by PCR NEGATIVE NEGATIVE    Comment: (NOTE) The Xpert Xpress SARS-CoV-2/FLU/RSV plus assay is intended as an aid in the diagnosis of influenza from Nasopharyngeal swab specimens and should not be used as a sole basis for treatment. Nasal washings and aspirates are unacceptable for Xpert Xpress SARS-CoV-2/FLU/RSV testing.  Fact Sheet for Patients: BloggerCourse.com  Fact Sheet for Healthcare Providers: SeriousBroker.it  This test is not yet approved or cleared by the [place] FDA and has been authorized for detection and/or diagnosis of SARS-CoV-2 by FDA under an Emergency Use Authorization (EUA). This EUA will remain in effect  (meaning this test can be used) for the duration of the COVID-19 declaration under Section 564(b)(1) of the Act, 21 U.S.C. section 360bbb-3(b)(1), unless the authorization is terminated or revoked.  Performed at [facility], [address] [place], [place] [address]      Review of Systems  Constitutional:  Positive for chills. Negative for fever.  Respiratory:  Positive for cough. Negative for wheezing.   Cardiovascular:  Negative for chest pain.   Physical Exam   Blood pressure 128/75, pulse 100, temperature 98.6 F (37 C), resp. rate 17, height 5\' 7"  (1.702 m), weight 127.5 kg, last menstrual period 04/17/2022, SpO2 100 %, unknown if currently breastfeeding.  Physical Exam Constitutional:  Appearance: She is ill-appearing.  HENT:     Head: Normocephalic.  Pulmonary:     Effort: Pulmonary effort is normal.     Breath sounds: No decreased breath sounds, wheezing or rhonchi.  Musculoskeletal:        General: Normal range of motion.  Neurological:     Mental Status: She is alert.     MAU Course  Procedures  MDM   + fetal movement.  Lungs CTA Flu /covid negative   Assessment and Plan   A:  1. Acute cough   2. Flu-like symptoms   3. [redacted] weeks gestation of pregnancy      P:  DC home Return to MAU if symptoms worsen Rx: Tessalon perles Rest Increase fluid intake.    Duane Lope, NP 07/19/2022 7:35 AM

## 2022-07-19 NOTE — Discharge Instructions (Signed)

## 2022-07-19 NOTE — MAU Note (Addendum)
.  Felicia [name] is a 31 y.o. at [redacted]w[redacted]d here in MAU reporting head congestion, shortness of breath, and cough since THurs am. Hx asthma and inhaler helps alittle but does not last long. Sore throat.  Wheezing. Some vomiting but thinks is due to drainage. Pt did home covid test THurs and was negative  Onset of complaint: Thurs am Pain score: 4 Vitals:   07/19/22 0321 07/19/22 0323  BP:  128/75  Pulse: 100   Resp: 17   Temp: 98.6 F (37 C)   SpO2: 100%      FHT: Lab orders placed from triage:  u/a, covid/flu

## 2022-07-19 NOTE — Progress Notes (Signed)
Written and verbal d/c instructions given and understanding voiced. PT will wait and leave at 0430 after receiving cough med at [facility]

## 2022-07-21 ENCOUNTER — Inpatient Hospital Stay (HOSPITAL_COMMUNITY)
Admission: AD | Admit: 2022-07-21 | Discharge: 2022-07-21 | Disposition: A | Discharge status: Home / Self Care | Payer: Managed Care, Other (non HMO) | Attending: Obstetrics & Gynecology | Admitting: Obstetrics & Gynecology

## 2022-07-21 ENCOUNTER — Other Ambulatory Visit: Payer: Self-pay

## 2022-07-21 ENCOUNTER — Inpatient Hospital Stay (HOSPITAL_COMMUNITY): Payer: Managed Care, Other (non HMO)

## 2022-07-21 ENCOUNTER — Encounter (HOSPITAL_COMMUNITY): Payer: Self-pay | Admitting: Obstetrics & Gynecology

## 2022-07-21 DIAGNOSIS — J45909 Unspecified asthma, uncomplicated: Secondary | ICD-10-CM | POA: Insufficient documentation

## 2022-07-21 DIAGNOSIS — Z1152 Encounter for screening for COVID-19: Secondary | ICD-10-CM | POA: Insufficient documentation

## 2022-07-21 DIAGNOSIS — O219 Vomiting of pregnancy, unspecified: Secondary | ICD-10-CM | POA: Insufficient documentation

## 2022-07-21 DIAGNOSIS — J069 Acute upper respiratory infection, unspecified: Secondary | ICD-10-CM

## 2022-07-21 DIAGNOSIS — Z3A13 13 weeks gestation of pregnancy: Secondary | ICD-10-CM | POA: Insufficient documentation

## 2022-07-21 DIAGNOSIS — Z79899 Other long term (current) drug therapy: Secondary | ICD-10-CM | POA: Diagnosis not present

## 2022-07-21 DIAGNOSIS — O99511 Diseases of the respiratory system complicating pregnancy, first trimester: Secondary | ICD-10-CM | POA: Diagnosis not present

## 2022-07-21 LAB — URINALYSIS, ROUTINE W REFLEX MICROSCOPIC
Bilirubin Urine: NEGATIVE
Glucose, UA: NEGATIVE mg/dL
Hgb urine dipstick: NEGATIVE
Ketones, ur: 5 mg/dL — AB
Leukocytes,Ua: NEGATIVE
Nitrite: NEGATIVE
Protein, ur: NEGATIVE mg/dL
Specific Gravity, Urine: 1.008 (ref 1.005–1.030)
pH: 8 (ref 5.0–8.0)

## 2022-07-21 LAB — COMPREHENSIVE METABOLIC PANEL
ALT: 16 U/L (ref 0–44)
AST: 20 U/L (ref 15–41)
Albumin: 2.9 g/dL — ABNORMAL LOW (ref 3.5–5.0)
Alkaline Phosphatase: 76 U/L (ref 38–126)
Anion gap: 11 (ref 5–15)
BUN: <5 mg/dL — ABNORMAL LOW (ref 6–20)
CO2: 22 mmol/L (ref 22–32)
Calcium: 9 mg/dL (ref 8.9–10.3)
Chloride: 105 mmol/L (ref 98–111)
Creatinine, Ser: 0.6 mg/dL (ref 0.44–1.00)
GFR, Estimated: >60 mL/min (ref >60)
Glucose, Bld: 95 mg/dL (ref 70–99)
Potassium: 4.1 mmol/L (ref 3.5–5.1)
Sodium: 138 mmol/L (ref 135–145)
Total Bilirubin: 0.2 mg/dL — ABNORMAL LOW (ref 0.3–1.2)
Total Protein: 6.8 g/dL (ref 6.5–8.1)

## 2022-07-21 LAB — CBC WITH DIFFERENTIAL/PLATELET
Abs Immature Granulocytes: 0.02 10*3/uL (ref 0.00–0.07)
Basophils Absolute: 0 10*3/uL (ref 0.0–0.1)
Basophils Relative: 0 %
Eosinophils Absolute: 0.1 10*3/uL (ref 0.0–0.5)
Eosinophils Relative: 2 %
HCT: 37.6 % (ref 36.0–46.0)
Hemoglobin: 12.8 g/dL (ref 12.0–15.0)
Immature Granulocytes: 0 %
Lymphocytes Relative: 21 %
Lymphs Abs: 1.8 10*3/uL (ref 0.7–4.0)
MCH: 29.6 pg (ref 26.0–34.0)
MCHC: 34 g/dL (ref 30.0–36.0)
MCV: 86.8 fL (ref 80.0–100.0)
Monocytes Absolute: 0.5 10*3/uL (ref 0.1–1.0)
Monocytes Relative: 5 %
Neutro Abs: 6.4 10*3/uL (ref 1.7–7.7)
Neutrophils Relative %: 72 %
Platelets: 260 10*3/uL (ref 150–400)
RBC: 4.33 MIL/uL (ref 3.87–5.11)
RDW: 13.9 % (ref 11.5–15.5)
WBC: 8.9 10*3/uL (ref 4.0–10.5)
nRBC: 0 % (ref 0.0–0.2)

## 2022-07-21 LAB — RESP PANEL BY RT-PCR (FLU A&B, COVID) ARPGX2
Influenza A by PCR: NEGATIVE
Influenza B by PCR: NEGATIVE
SARS Coronavirus 2 by RT PCR: NEGATIVE

## 2022-07-21 MED ORDER — PREDNISONE 20 MG PO TABS: 40.0000 mg | ORAL_TABLET | Freq: Every day | ORAL | 0 refills | Status: AC

## 2022-07-21 MED ORDER — IPRATROPIUM-ALBUTEROL 0.5-2.5 (3) MG/3ML IN SOLN
3.0000 mL | Freq: Once | RESPIRATORY_TRACT | Status: AC
  Administered 2022-07-21: 3 mL via RESPIRATORY_TRACT
  Filled 2022-07-21: qty 3

## 2022-07-21 MED ORDER — GUAIFENESIN-CODEINE 100-10 MG/5ML PO SOLN: 10.0000 mL | Freq: Four times a day (QID) | ORAL | 0 refills | Status: DC | PRN

## 2022-07-21 MED ORDER — METHYLPREDNISOLONE SODIUM SUCC 125 MG IJ SOLR: 60.0000 mg | Freq: Once | INTRAMUSCULAR | Status: DC

## 2022-07-21 MED ORDER — METHYLPREDNISOLONE SODIUM SUCC 125 MG IJ SOLR
40.0000 mg | Freq: Once | INTRAMUSCULAR | Status: AC
  Administered 2022-07-21: 40 mg via INTRAMUSCULAR
  Filled 2022-07-21: qty 2

## 2022-07-21 MED ORDER — LACTATED RINGERS IV BOLUS
1000.0000 mL | Freq: Once | INTRAVENOUS | Status: AC
  Administered 2022-07-21: 1000 mL via INTRAVENOUS

## 2022-07-21 MED ORDER — GUAIFENESIN-CODEINE 100-10 MG/5ML PO SOLN
10.0000 mL | Freq: Once | ORAL | Status: AC
  Administered 2022-07-21: 10 mL via ORAL
  Filled 2022-07-21: qty 10

## 2022-07-21 NOTE — MAU Note (Signed)
Felicia [name] is a 31 y.o. at [redacted]w[redacted]d here in MAU reporting: SOB, wheezing, chest tightness, N/V since 06/17/22. Pt was here Friday with same Sx. Pt reports Sx have not improved nor worsened.  Pt hasn't been able to tolerate POs in over 24hrs. Reports temp of 100.1 11/18. Afebrile today.  Pain score: 0 Vitals:   07/21/22 1219  BP: 113/67  Pulse: 84  Temp: 98.1 F (36.7 C)     FHT: 160 bpm

## 2022-07-21 NOTE — MAU Provider Note (Signed)
History     CSN: [id number]  Arrival date and time: 07/21/22 1208   Event Date/Time   First Provider Initiated Contact with Patient 07/21/22 1253      Chief Complaint  Patient presents with   Shortness of Breath   Nausea   HPI   [name] is a 31 y.o. female [phone] @ 8567w4d here with worsening cough and URI symptoms. Patient arrived unannounced via EMS.  She was here on Friday with the same complaints. She has no improvement in her symptoms. She feels dehydrated. She reports coughing so hard that she is spitting and at times vomiting. She was given [employer] but reports they do not work well.  She was given an Atrovent inhaler and has been using that as prescribed.  She continues to have decreased appetite. She has no chest pain or palpations. Some shortness of breath when she starts coughing.   OB History     Gravida  5   Para  3   Term  3   Preterm      AB  1   Living  3      SAB  1   IAB      Ectopic      Multiple  0   Live Births  3           Past Medical History:  Diagnosis Date   Asthma    Hidradenitis suppurativa     Past Surgical History:  Procedure Laterality Date   CHOLECYSTECTOMY  2011   DILATION AND EVACUATION N/A 03/07/2015   Procedure: DILATATION AND EVACUATION;  Surgeon: Levi AlandMark E Anderson, MD;  Location: [facility];  Service: Gynecology;  Laterality: N/A;    Family History  Problem Relation Age of Onset   Diabetes Mother    Hypertension Father     Social History   Tobacco Use   Smoking status: Never   Smokeless tobacco: Never  Vaping Use   Vaping Use: Never used  Substance Use Topics   Alcohol use: No   Drug use: No    Allergies:  Allergies  Allergen Reactions   Amoxicillin     "It does something to my liver enzymes and my skin turns yellow."    Medications Prior to Admission  Medication Sig Dispense Refill Last Dose   acetaminophen (TYLENOL) 500 MG tablet Take 500 mg by mouth daily as needed for mild  pain or moderate pain.   07/21/2022 at 0800   aspirin EC 81 MG tablet Take 1 tablet (81 mg total) by mouth daily. Swallow whole. 30 tablet 12 07/21/2022 at 0900   benzonatate (TESSALON) 200 MG capsule Take 1 capsule (200 mg total) by mouth 3 (three) times daily as needed for cough. 30 capsule 0 07/21/2022 at 0930   fluticasone (FLOVENT HFA) 44 MCG/ACT inhaler Inhale 2 puffs into the lungs 2 (two) times daily. 1 each 2 07/21/2022 at 1030   Prenatal Vit-Fe Fumarate-FA (PRENATAL MULTIVITAMIN) TABS tablet Take 1 tablet by mouth at bedtime.    07/20/2022 at 2100   promethazine (PHENERGAN) 12.5 MG tablet Take 1 tablet (12.5 mg total) by mouth every 6 (six) hours as needed for nausea or vomiting. 30 tablet 0 07/21/2022 at 0800   montelukast (SINGULAIR) 10 MG tablet Take 10 mg by mouth daily. (Patient not taking: Reported on 07/09/2022)      Results for orders placed or performed during the hospital encounter of 07/21/22 (from the past 48 hour(s))  Resp Panel by  RT-PCR (Flu A&B, Covid) Anterior Nasal Swab     Status: None   Collection Time: 07/21/22  1:03 PM   Specimen: Anterior Nasal Swab  Result Value Ref Range   SARS Coronavirus 2 by RT PCR NEGATIVE NEGATIVE    Comment: (NOTE) SARS-CoV-2 target nucleic acids are NOT DETECTED.  The SARS-CoV-2 RNA is generally detectable in upper respiratory specimens during the acute phase of infection. The lowest concentration of SARS-CoV-2 viral copies this assay can detect is 138 copies/mL. A negative result does not preclude SARS-Cov-2 infection and should not be used as the sole basis for treatment or other patient management decisions. A negative result may occur with  improper specimen collection/handling, submission of specimen other than nasopharyngeal swab, presence of viral mutation(s) within the areas targeted by this assay, and inadequate number of viral copies(<138 copies/mL). A negative result must be combined with clinical observations, patient  history, and epidemiological information. The expected result is Negative.  Fact Sheet for Patients:  BloggerCourse.com  Fact Sheet for Healthcare Providers:  SeriousBroker.it  This test is no t yet approved or cleared by the [place] FDA and  has been authorized for detection and/or diagnosis of SARS-CoV-2 by FDA under an Emergency Use Authorization (EUA). This EUA will remain  in effect (meaning this test can be used) for the duration of the COVID-19 declaration under Section 564(b)(1) of the Act, 21 U.S.C.section 360bbb-3(b)(1), unless the authorization is terminated  or revoked sooner.       Influenza A by PCR NEGATIVE NEGATIVE   Influenza B by PCR NEGATIVE NEGATIVE    Comment: (NOTE) The Xpert Xpress SARS-CoV-2/FLU/RSV plus assay is intended as an aid in the diagnosis of influenza from Nasopharyngeal swab specimens and should not be used as a sole basis for treatment. Nasal washings and aspirates are unacceptable for Xpert Xpress SARS-CoV-2/FLU/RSV testing.  Fact Sheet for Patients: BloggerCourse.com  Fact Sheet for Healthcare Providers: SeriousBroker.it  This test is not yet approved or cleared by the [place] FDA and has been authorized for detection and/or diagnosis of SARS-CoV-2 by FDA under an Emergency Use Authorization (EUA). This EUA will remain in effect (meaning this test can be used) for the duration of the COVID-19 declaration under Section 564(b)(1) of the Act, 21 U.S.C. section 360bbb-3(b)(1), unless the authorization is terminated or revoked.  Performed at [facility], [address] [place], [place] [address]   Urinalysis, Routine w reflex microscopic Anterior Nasal Swab     Status: Abnormal   Collection Time: 07/21/22  1:03 PM  Result Value Ref Range   Color, Urine YELLOW YELLOW   APPearance CLEAR CLEAR   Specific Gravity, Urine  1.008 1.005 - 1.030   pH 8.0 5.0 - 8.0   Glucose, UA NEGATIVE NEGATIVE mg/dL   Hgb urine dipstick NEGATIVE NEGATIVE   Bilirubin Urine NEGATIVE NEGATIVE   Ketones, ur 5 (A) NEGATIVE mg/dL   Protein, ur NEGATIVE NEGATIVE mg/dL   Nitrite NEGATIVE NEGATIVE   Leukocytes,Ua NEGATIVE NEGATIVE    Comment: Performed at [facility], [address], [place], [place] [address]  CBC with Differential/Platelet     Status: None   Collection Time: 07/21/22  1:16 PM  Result Value Ref Range   WBC 8.9 4.0 - 10.5 K/uL   RBC 4.33 3.87 - 5.11 MIL/uL   Hemoglobin 12.8 12.0 - 15.0 g/dL   HCT 74.2 59.5 - 63.8 %   MCV 86.8 80.0 - 100.0 fL   MCH 29.6 26.0 -  34.0 pg   MCHC 34.0 30.0 - 36.0 g/dL   RDW 50.2 77.4 - 12.8 %   Platelets 260 150 - 400 K/uL   nRBC 0.0 0.0 - 0.2 %   Neutrophils Relative % 72 %   Neutro Abs 6.4 1.7 - 7.7 K/uL   Lymphocytes Relative 21 %   Lymphs Abs 1.8 0.7 - 4.0 K/uL   Monocytes Relative 5 %   Monocytes Absolute 0.5 0.1 - 1.0 K/uL   Eosinophils Relative 2 %   Eosinophils Absolute 0.1 0.0 - 0.5 K/uL   Basophils Relative 0 %   Basophils Absolute 0.0 0.0 - 0.1 K/uL   Immature Granulocytes 0 %   Abs Immature Granulocytes 0.02 0.00 - 0.07 K/uL    Comment: Performed at [facility], [address], [place], [place] [address]  Comprehensive metabolic panel     Status: Abnormal   Collection Time: 07/21/22  1:16 PM  Result Value Ref Range   Sodium 138 135 - 145 mmol/L   Potassium 4.1 3.5 - 5.1 mmol/L   Chloride 105 98 - 111 mmol/L   CO2 22 22 - 32 mmol/L   Glucose, Bld 95 70 - 99 mg/dL    Comment: Glucose reference range applies only to samples taken after fasting for at least 8 hours.   BUN <5 (L) 6 - 20 mg/dL   Creatinine, Ser 7.20 0.44 - 1.00 mg/dL   Calcium 9.0 8.9 - 94.7 mg/dL   Total Protein 6.8 6.5 - 8.1 g/dL   Albumin 2.9 (L) 3.5 - 5.0 g/dL   AST 20 15 - 41 U/L   ALT 16 0 - 44 U/L   Alkaline Phosphatase 76 38 - 126 U/L   Total Bilirubin 0.2 (L) 0.3 - 1.2  mg/dL   GFR, Estimated >09 >62 mL/min    Comment: (NOTE) Calculated using the CKD-EPI Creatinine Equation (2021)    Anion gap 11 5 - 15    Comment: Performed at [facility], [address], [place], [place] [address]     Review of Systems  Respiratory:  Positive for cough and wheezing. Negative for chest tightness.   Cardiovascular:  Negative for chest pain, palpitations and leg swelling.   Physical Exam   Blood pressure (!) 123/59, pulse 80, temperature 98.1 F (36.7 C), temperature source Oral, resp. rate 19, last menstrual period 04/17/2022, SpO2 99 %, unknown if currently breastfeeding.  Physical Exam Constitutional:      General: She is not in acute distress.    Appearance: She is well-developed. She is ill-appearing. She is not toxic-appearing or diaphoretic.  HENT:     Head: Normocephalic.  Eyes:     Pupils: Pupils are equal, round, and reactive to light.  Pulmonary:     Breath sounds: Examination of the right-upper field reveals wheezing. Examination of the right-middle field reveals wheezing. Examination of the right-lower field reveals wheezing. Wheezing present. No decreased breath sounds.  Skin:    General: Skin is warm.  Neurological:     Mental Status: She is alert and oriented to person, place, and time.  Psychiatric:        Behavior: Behavior normal.     MAU Course  Procedures None  MDM  + fetal heart tones via doppler.  Lr bolus X 1 CBC and CMP stable UA with urine culture pending CXray- negative for acute findings Flu and Covid negative Duo-nebulizer complete- patient without wheezing following treatment. Reports feeling much better Codein cough given.  Solumedrol given IM- Discussed patient with Dr. Macon Large. Will send home with a short course of steroids.   Assessment and Plan   A:  1. URI, acute   2. [redacted] weeks gestation of pregnancy      P:  DC home Rest Increase fluids Rx: Codeine cough, prednisone x 5 days Return to MAU if  symptoms worsen  Venia Carbon I, NP 07/21/2022 6:18 PM

## 2022-07-22 LAB — CULTURE, OB URINE: Culture: NO GROWTH

## 2022-07-24 LAB — PANORAMA PRENATAL TEST FULL PANEL:PANORAMA TEST PLUS 5 ADDITIONAL MICRODELETIONS: FETAL FRACTION: 6.2

## 2022-07-26 LAB — HORIZON CUSTOM: REPORT SUMMARY: NEGATIVE

## 2022-08-05 ENCOUNTER — Ambulatory Visit (INDEPENDENT_AMBULATORY_CARE_PROVIDER_SITE_OTHER): Payer: Managed Care, Other (non HMO) | Admitting: Obstetrics & Gynecology

## 2022-08-05 VITALS — BP 120/80 | HR 90 | Wt 279.0 lb

## 2022-08-05 DIAGNOSIS — Z23 Encounter for immunization: Secondary | ICD-10-CM | POA: Diagnosis not present

## 2022-08-05 DIAGNOSIS — O0992 Supervision of high risk pregnancy, unspecified, second trimester: Secondary | ICD-10-CM

## 2022-08-05 DIAGNOSIS — L732 Hidradenitis suppurativa: Secondary | ICD-10-CM

## 2022-08-05 DIAGNOSIS — O0990 Supervision of high risk pregnancy, unspecified, unspecified trimester: Secondary | ICD-10-CM

## 2022-08-05 DIAGNOSIS — Z3A15 15 weeks gestation of pregnancy: Secondary | ICD-10-CM

## 2022-08-05 MED ORDER — CLINDAMYCIN HCL 300 MG PO CAPS: 300.0000 mg | ORAL_CAPSULE | Freq: Three times a day (TID) | ORAL | 0 refills | Status: AC

## 2022-08-05 NOTE — Progress Notes (Signed)
   LOW-RISK PREGNANCY VISIT Patient name: Felicia [name] MRN [id number]  Date of birth: April 19, 1991 Chief Complaint:   Routine Prenatal Visit  History of Present Illness:   Felicia [name] is a 31 y.o. [phone] female at [redacted]w[redacted]d with an Estimated Date of Delivery: 01/22/23 being seen today for ongoing management of a low-risk pregnancy.   -Obesity -HS She notes that typically symptoms controlled with diet management; however, notes abscess at her armpit.  No drainage- pain, swelling and redness.     07/09/2022   10:06 AM  Depression screen PHQ 2/9  Decreased Interest 0  Down, Depressed, Hopeless 0  PHQ - 2 Score 0  Altered sleeping 0  Tired, decreased energy 1  Change in appetite 0  Feeling bad or failure about yourself  0  Trouble concentrating 0  Moving slowly or fidgety/restless 0  Suicidal thoughts 0  PHQ-9 Score 1     Contractions: Not present. Denies vaginal bleeding.  Not yet feeling fetal movement. .   . denies leaking of fluid. Review of Systems:   Pertinent items are noted in HPI Denies abnormal vaginal discharge w/ itching/odor/irritation, headaches, visual changes, shortness of breath, chest pain, abdominal pain, severe nausea/vomiting, or problems with urination or bowel movements unless otherwise stated above. Pertinent History Reviewed:  Reviewed past medical,surgical, social, obstetrical and family history.  Reviewed problem list, medications and allergies.  Physical Assessment:   Vitals:   08/05/22 1444  BP: 120/80  Pulse: 90  Weight: 279 lb (126.6 kg)  Body mass index is 43.7 kg/m.        Physical Examination:   General appearance: Well appearing, and in no distress  Mental status: Alert, oriented to person, place, and time  Skin: Warm & dry   Right axilla ~ 5-6cm area of erythema with tenderness, no active drainage  Respiratory: Normal respiratory effort, no distress  Abdomen: Soft, gravid, nontender  Pelvic: Cervical exam deferred         Extremities:  Edema: None  Psych:  mood and affect appropriate  Fetal Status: Fetal Heart Rate (bpm): 150        Chaperone: n/a    No results found for this or any previous visit (from the past 24 hour(s)).   Assessment & Plan:  1) Low-risk pregnancy [id number] at [redacted]w[redacted]d with an Estimated Date of Delivery: 01/22/23   Skin abscess 2/2 HS -reviewed warm compresses -antibiotic sent in, pt to f/u if no improvement   Meds:  Meds ordered this encounter  Medications   clindamycin (CLEOCIN) 300 MG capsule    Sig: Take 1 capsule (300 mg total) by mouth 3 (three) times daily for 7 days.    Dispense:  21 capsule    Refill:  0   Labs/procedures today: AFP only, anatomy scan in 4wks, flu shot today  Plan:  Continue routine obstetrical care  Next visit: prefers in person    Reviewed: Preterm labor symptoms and general obstetric precautions including but not limited to vaginal bleeding, contractions, leaking of fluid and fetal movement were reviewed in detail with the patient.  All questions were answered.   Follow-up: Return in about 4 weeks (around 09/02/2022).  Orders Placed This Encounter  Procedures   Flu Vaccine QUAD 35mo+IM (Fluarix, Fluzone & Alfiuria Quad PF)   Alpha fetoprotein, maternal    Myna Hidalgo, DO Attending Obstetrician & Gynecologist, Faculty Practice Center for [employer], [facility]

## 2022-08-06 LAB — ALPHA FETOPROTEIN, MATERNAL
AFP MoM: 1.7
AFP, Serum: 37.8 ng/mL
Calc'd Gestational Age: 15.7 weeks
Maternal Wt: 279 [lb_av]
Risk for ONTD: 1
Twins-AFP: 1

## 2022-08-07 ENCOUNTER — Encounter: Payer: Self-pay | Admitting: Obstetrics & Gynecology

## 2022-08-07 ENCOUNTER — Other Ambulatory Visit (HOSPITAL_COMMUNITY)
Admission: RE | Admit: 2022-08-07 | Discharge: 2022-08-07 | Disposition: A | Discharge status: Home / Self Care | Payer: Managed Care, Other (non HMO) | Source: Ambulatory Visit | Attending: Obstetrics and Gynecology | Admitting: Obstetrics and Gynecology

## 2022-08-07 ENCOUNTER — Telehealth: Payer: Self-pay

## 2022-08-07 ENCOUNTER — Ambulatory Visit (INDEPENDENT_AMBULATORY_CARE_PROVIDER_SITE_OTHER): Payer: Managed Care, Other (non HMO)

## 2022-08-07 DIAGNOSIS — R300 Dysuria: Secondary | ICD-10-CM

## 2022-08-07 LAB — POCT URINALYSIS DIPSTICK
Bilirubin, UA: NEGATIVE
Blood, UA: NEGATIVE
Glucose, UA: NEGATIVE
Ketones, UA: NEGATIVE
Nitrite, UA: NEGATIVE
Protein, UA: NEGATIVE
Spec Grav, UA: 1.015 (ref 1.010–1.025)
Urobilinogen, UA: 0.2 E.U./dL
pH, UA: 5 (ref 5.0–8.0)

## 2022-08-07 NOTE — Progress Notes (Signed)
Pt here for nurse visit FHT check, urine culture and self swab per Dr.Duncan due to light pink spotting when she wipes after urination. FHT 156. Pt c/o dysuria and increased vaginal discharge. Urine culture and self swab sent. Pt was told to go to MAU if she has period like bleeding and/or cramping. Pt expressed understanding.

## 2022-08-07 NOTE — Telephone Encounter (Signed)
Returning pt call. Pt states she is having a small amount of light pink spotting when she wipes after urination. Pt also c/o vaginal discharge. Pt denies recent intercourse. Pt states she is also experiencing mild abdominal cramping. Per Dr.Duncan pt should come for self swab, urine culture and FHT check as nurse visit today. Pt scheduled for 2:00 due to pt's schedule. Pt was told to go to MAU if bleeding increases.

## 2022-08-08 LAB — CERVICOVAGINAL ANCILLARY ONLY
Bacterial Vaginitis (gardnerella): NEGATIVE
Candida Glabrata: NEGATIVE
Candida Vaginitis: NEGATIVE
Comment: NEGATIVE
Comment: NEGATIVE
Comment: NEGATIVE

## 2022-08-09 LAB — CULTURE, OB URINE

## 2022-08-09 LAB — URINE CULTURE, OB REFLEX

## 2022-08-15 ENCOUNTER — Ambulatory Visit: Payer: Managed Care, Other (non HMO) | Admitting: *Deleted

## 2022-08-15 ENCOUNTER — Ambulatory Visit: Payer: Managed Care, Other (non HMO) | Attending: Obstetrics and Gynecology | Admitting: Maternal & Fetal Medicine

## 2022-08-15 ENCOUNTER — Encounter: Payer: Self-pay | Admitting: *Deleted

## 2022-08-15 ENCOUNTER — Ambulatory Visit: Payer: Managed Care, Other (non HMO) | Attending: Obstetrics and Gynecology

## 2022-08-15 VITALS — BP 126/71 | HR 89

## 2022-08-15 DIAGNOSIS — O09299 Supervision of pregnancy with other poor reproductive or obstetric history, unspecified trimester: Secondary | ICD-10-CM | POA: Diagnosis not present

## 2022-08-15 DIAGNOSIS — Z363 Encounter for antenatal screening for malformations: Secondary | ICD-10-CM | POA: Insufficient documentation

## 2022-08-15 DIAGNOSIS — Z3689 Encounter for other specified antenatal screening: Secondary | ICD-10-CM | POA: Diagnosis present

## 2022-08-15 DIAGNOSIS — O09292 Supervision of pregnancy with other poor reproductive or obstetric history, second trimester: Secondary | ICD-10-CM

## 2022-08-15 DIAGNOSIS — E669 Obesity, unspecified: Secondary | ICD-10-CM

## 2022-08-15 DIAGNOSIS — O352XX0 Maternal care for (suspected) hereditary disease in fetus, not applicable or unspecified: Secondary | ICD-10-CM

## 2022-08-15 DIAGNOSIS — O0990 Supervision of high risk pregnancy, unspecified, unspecified trimester: Secondary | ICD-10-CM | POA: Insufficient documentation

## 2022-08-15 DIAGNOSIS — O99210 Obesity complicating pregnancy, unspecified trimester: Secondary | ICD-10-CM | POA: Diagnosis not present

## 2022-08-15 DIAGNOSIS — O99212 Obesity complicating pregnancy, second trimester: Secondary | ICD-10-CM | POA: Diagnosis not present

## 2022-08-15 DIAGNOSIS — Z3A17 17 weeks gestation of pregnancy: Secondary | ICD-10-CM

## 2022-08-15 NOTE — Progress Notes (Signed)
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 [id number] at 4w1dhere for ultrasound and consultation.   Felicia [name] has a history of 5 total pregnancies.  Her first 2 pregnancies were uneventful term vaginal deliveries.  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  Currently that child is 617years old and doing well.  He no longer has a tracheostomy or feeding tube and is often not require a wheelchair for ambulation.  She reports that her [facility] child [facility] any sort of genetic etiology for her son's muscular disorder.  She has had negative SMA carrier screening.  This pregnancy she has had a normal cell free DNA screening test for aneuploidy.  Her only complication is obesity with a pregravid BMI of 41.96.  She also is taking an aspirin for preeclampsia prophylaxis.  Her other medications are a prenatal vitamin and Flovent as needed.  She has a history of macrosomia as well.  Due to this I recommend she have an early 1 hour glucose challenge test.  After the ultrasound and our discussion of her medical history Felicia [name] and her husband had no further questions.  They reported understanding of the pregnancy plan of care and request to proceed as as outlined below.  Review of Systems: A review of systems was performed and was negative except per HPI   Vitals and Physical Exam See intake sheet for vitals Sitting comfortably on the sonogram table Nonlabored breathing Normal rate and rhythm Abdomen is nontender  Genetic testing: Low risk NIPS (female)  Sonographic findings Single intrauterine pregnancy. Observed fetal cardiac activity. Cephalic  presentation. Fetal anatomy that was well seen appears normal without evidence of soft markers. Not all fetal structures were well seen due to a technically diffcult exam and the anatomic survey remains incomplete with limited views of the sacral spine, heart, nose/lips.  Fetal biometry shows the estimated fetal weight at the 70 percentile.  Amniotic fluid volume: Within normal limits. Placenta: Posterior. Cervix: Normal appearance by transabdominal scan with a cervical length of 4.1 cm. Adnexa: No masses visualized.  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, laryngomalacia) - History of macrosomia - Obesity (pregravid BMI 41.96) Plan -Since the patient has met with [facility] and has had triad testing there is no further indication for genetic counseling at this time -Detailed anatomic survey was done today without evidence of birth defects or anomalies although it was limited by fetal position -Serial growth ultrasounds every 4 to 6 weeks until delivery -Antenatal testing around 36 weeks due to BMI over 40 -Continue low-dose aspirin for preeclampsia prophylaxis until about 37 weeks -Early 1 hour glucose challenge test due to her history of macrosomia and current obesity -Delivery timing will be based on her prenatal course but likely around 39 weeks.  I spent 45 minutes reviewing the patients chart, including labs and images as well as counseling the patient about her medical conditions.  BValeda Malm MFM, [place]  08/15/2022  3:13 PM

## 2022-08-16 ENCOUNTER — Other Ambulatory Visit: Payer: Self-pay | Admitting: *Deleted

## 2022-08-16 DIAGNOSIS — O09299 Supervision of pregnancy with other poor reproductive or obstetric history, unspecified trimester: Secondary | ICD-10-CM

## 2022-08-16 DIAGNOSIS — Z362 Encounter for other antenatal screening follow-up: Secondary | ICD-10-CM

## 2022-08-16 DIAGNOSIS — O99212 Obesity complicating pregnancy, second trimester: Secondary | ICD-10-CM

## 2022-09-02 NOTE — L&D Delivery Note (Addendum)
OB/GYN Faculty Practice Delivery Note  Felicia [name] is a 32 y.o. [id number] s/p VD at [redacted]w[redacted]d. She was admitted for IOL for gHTN v PreE.   ROM: 1h 66m with clear fluid GBS Status:  Negative/-- (04/29 0000) Maximum Maternal Temperature: 98.3  Labor Progress: Initial SVE: fingertip/thick/-3. Received dual Cytotec, FB, pitocin, and AROM to augment labor. She then progressed to complete.   Delivery Date/Time: 01/06/23 1946 Delivery: Called to room and patient was complete and pushing. Head delivered LOA. No nuchal cord present. Shoulder and body delivered in usual fashion. Infant with spontaneous cry, placed on mother's abdomen, dried and stimulated. Cord clamped x 2 after 1-minute delay, and cut by FOB. Cord blood drawn. Placenta delivered spontaneously with gentle cord traction. Fundus firm with massage and Pitocin. Labia, perineum, vagina, and cervix inspected with 1st degree perineal laceration, repaired.  Baby Weight: pending  Placenta: 3 vessel, intact. Sent to L&D Complications: None Lacerations: 1st degree perineal lac x1, repaired with 3.0 vicryl EBL: 325 mL Analgesia: Epidural   Infant:  APGAR (1 MIN):  9 APGAR (5 MINS):  9  Vonna Drafts, MD FM PGY1, Faculty Practice Center for [employer], [facility] 01/06/2023, 8:10 PM  GME ATTESTATION:  I saw and evaluated the patient. I agree with the findings and the plan of care as documented in the resident's note. I have made changes to documentation as necessary.  Lavonda Jumbo, DO OB Fellow, Faculty [facility], Center for [facility] 01/06/2023, 8:18 PM

## 2022-09-08 ENCOUNTER — Inpatient Hospital Stay (HOSPITAL_COMMUNITY)
Admission: AD | Admit: 2022-09-08 | Discharge: 2022-09-08 | Disposition: A | Discharge status: Home / Self Care | Payer: Managed Care, Other (non HMO) | Attending: Obstetrics & Gynecology | Admitting: Obstetrics & Gynecology

## 2022-09-08 ENCOUNTER — Encounter (HOSPITAL_COMMUNITY): Payer: Self-pay | Admitting: Obstetrics & Gynecology

## 2022-09-08 DIAGNOSIS — O99712 Diseases of the skin and subcutaneous tissue complicating pregnancy, second trimester: Secondary | ICD-10-CM | POA: Insufficient documentation

## 2022-09-08 DIAGNOSIS — Z3A20 20 weeks gestation of pregnancy: Secondary | ICD-10-CM | POA: Insufficient documentation

## 2022-09-08 DIAGNOSIS — L732 Hidradenitis suppurativa: Secondary | ICD-10-CM | POA: Diagnosis not present

## 2022-09-08 MED ORDER — HYDROCODONE-ACETAMINOPHEN 10-325 MG PO TABS: 1.0000 | ORAL_TABLET | ORAL | 0 refills | Status: DC | PRN

## 2022-09-08 MED ORDER — LIDOCAINE HCL (PF) 1 % IJ SOLN
10.0000 mL | Freq: Once | INTRAMUSCULAR | Status: AC
  Administered 2022-09-08: 10 mL via INTRADERMAL
  Filled 2022-09-08: qty 10

## 2022-09-08 MED ORDER — SULFAMETHOXAZOLE-TRIMETHOPRIM 800-160 MG PO TABS: 1.0000 | ORAL_TABLET | Freq: Two times a day (BID) | ORAL | 0 refills | Status: AC

## 2022-09-08 MED ORDER — WHITE PETROLATUM EX OINT: TOPICAL_OINTMENT | CUTANEOUS | Status: DC | PRN

## 2022-09-08 MED ORDER — HYDROCODONE-ACETAMINOPHEN 5-325 MG PO TABS
2.0000 | ORAL_TABLET | Freq: Once | ORAL | Status: AC
  Administered 2022-09-08: 2 via ORAL
  Filled 2022-09-08: qty 2

## 2022-09-08 MED ORDER — CHLORHEXIDINE GLUCONATE 4 % EX LIQD: Freq: Two times a day (BID) | CUTANEOUS | 0 refills | Status: DC

## 2022-09-08 NOTE — MAU Note (Signed)
Felicia [name] is a 32 y.o. at [redacted]w[redacted]d here in MAU reporting: was told to come in by on call dr. (has apt tomorrow).  Really bad flare, couple areas under rt arm have gotten really bad. 2 are draining, one is hurting, one is not.  Hurts a lot when she moves.  Denies fever. Took Tylenol at 1450 No preg related complaints Onset of complaint: started last wk, got really bad last night Pain score: 4 up to an 8 with movement Vitals:   09/08/22 1721  BP: 129/66  Pulse: 84  Resp: 18  Temp: 98.2 F (36.8 C)  SpO2: 100%     FHT:154 Lab orders placed from triage:  none

## 2022-09-08 NOTE — MAU Provider Note (Signed)
MAU Provider Note  History  [id number]  Arrival date and time: 09/08/22 1656   Chief Complaint  Patient presents with   Arm Abcess     HPI Felicia [name] is a 32 y.o. [id number] at [redacted]w[redacted]d by LMP with PMHx notable for BMI 44, hidradenitis suppurativa, who presents for flareup of her hidradenitis suppurativa particularly on her right underarm.  Patient started clindamycin for about a week on 12/4 which did help some of her flare of symptoms, however I has recently worsened and is very painful, burning sensation and actively draining pus.  Patient declines fevers, chills, shortness of breath, chest pain, changes to bowel movement, recent sick contacts.  Vaginal bleeding: No LOF: No Fetal Movement: Yes Contractions: No  A/RH(D) POSITIVE/-- (11/07 1040)  OB History     Gravida  5   Para  3   Term  3   Preterm      AB  1   Living  3      SAB  1   IAB      Ectopic      Multiple  0   Live Births  3           Past Medical History:  Diagnosis Date   Asthma    Hidradenitis suppurativa     Past Surgical History:  Procedure Laterality Date   CHOLECYSTECTOMY  2011   DILATION AND EVACUATION N/A 03/07/2015   Procedure: DILATATION AND EVACUATION;  Surgeon: Levi Aland, MD;  Location: [facility];  Service: Gynecology;  Laterality: N/A;    Family History  Problem Relation Age of Onset   Diabetes Mother    Hypertension Father     Social History   Socioeconomic History   Marital status: Married    Spouse name: Not on file   Number of children: Not on file   Years of education: Not on file   Highest education level: Not on file  Occupational History   Not on file  Tobacco Use   Smoking status: Never   Smokeless tobacco: Never  Vaping Use   Vaping Use: Never used  Substance and Sexual Activity   Alcohol use: No   Drug use: No   Sexual activity: Yes    Birth control/protection: None  Other Topics Concern   Not on file  Social History Narrative   Not on  file   Social Determinants of Health   Financial Resource Strain: Not on file  Food Insecurity: Not on file  Transportation Needs: Not on file  Physical Activity: Not on file  Stress: Not on file  Social Connections: Not on file  Intimate Partner Violence: Not on file    Allergies  Allergen Reactions   Amoxicillin     "It does something to my liver enzymes and my skin turns yellow."    No current facility-administered medications on file prior to encounter.   Current Outpatient Medications on File Prior to Encounter  Medication Sig Dispense Refill   acetaminophen (TYLENOL) 500 MG tablet Take 500 mg by mouth daily as needed for mild pain or moderate pain.     aspirin EC 81 MG tablet Take 1 tablet (81 mg total) by mouth daily. Swallow whole. 30 tablet 12   benzonatate (TESSALON) 200 MG capsule Take 1 capsule (200 mg total) by mouth 3 (three) times daily as needed for cough. (Patient not taking: Reported on 08/05/2022) 30 capsule 0   fluticasone (FLOVENT HFA) 44 MCG/ACT inhaler Inhale 2 puffs into  the lungs 2 (two) times daily. 1 each 2   guaiFENesin-codeine 100-10 MG/5ML syrup Take 10 mLs by mouth every 6 (six) hours as needed for cough. (Patient not taking: Reported on 08/05/2022) 120 mL 0   montelukast (SINGULAIR) 10 MG tablet Take 10 mg by mouth daily. (Patient not taking: Reported on 07/09/2022)     Prenatal Vit-Fe Fumarate-FA (PRENATAL MULTIVITAMIN) TABS tablet Take 1 tablet by mouth at bedtime.      promethazine (PHENERGAN) 12.5 MG tablet Take 1 tablet (12.5 mg total) by mouth every 6 (six) hours as needed for nausea or vomiting. (Patient not taking: Reported on 08/05/2022) 30 tablet 0     Review of Systems  Constitutional:  Negative for appetite change, chills and fever.  HENT:  Negative for congestion, facial swelling and postnasal drip.   Eyes:  Negative for photophobia.  Respiratory:  Negative for cough and shortness of breath.   Cardiovascular:  Negative for chest pain.   Gastrointestinal:  Negative for abdominal pain, nausea and vomiting.  Endocrine: Negative for polyuria.  Genitourinary:  Negative for flank pain and pelvic pain.  Musculoskeletal:  Negative for arthralgias.  Skin:  Positive for wound. Negative for rash.  Neurological:  Negative for weakness and light-headedness.  Psychiatric/Behavioral:  Negative for confusion.     Pertinent positives and negative per HPI, all others reviewed and negative  Physical Exam   BP 129/66 (BP Location: Right Arm)   Pulse 84   Temp 98.2 F (36.8 C) (Oral)   Resp 18   Ht 5\' 7"  (1.702 m)   Wt 129.5 kg   LMP 04/17/2022   SpO2 100%   BMI 44.70 kg/m   Patient Vitals for the past 24 hrs:  BP Temp Temp src Pulse Resp SpO2 Height Weight  09/08/22 1721 129/66 98.2 F (36.8 C) Oral 84 18 100 % 5\' 7"  (1.702 m) 129.5 kg    Physical Exam Vitals reviewed.  Constitutional:      Appearance: Normal appearance.  HENT:     Head: Normocephalic and atraumatic.     Right Ear: External ear normal.     Left Ear: External ear normal.  Cardiovascular:     Rate and Rhythm: Normal rate and regular rhythm.  Pulmonary:     Effort: Pulmonary effort is normal.     Breath sounds: Normal breath sounds.  Abdominal:     General: Abdomen is flat.     Palpations: Abdomen is soft.  Skin:    General: Skin is warm.     Capillary Refill: Capillary refill takes less than 2 seconds.     Findings: Wound present. No rash.          Comments: R underarm with 3-4 erythematous nodules with sinus tracts, 2 nodules actively draining yellow pus.  Red streaking from top nodule to middle of medial arm.  Neurological:     General: No focal deficit present.  Psychiatric:        Mood and Affect: Mood normal.    Bedside Ultrasound not done  FHT Baseline 154 bpm on doppler  Labs No results found for this or any previous visit (from the past 24 hour(s)).  Imaging No results found.  MAU Course  Procedures  Incision and  Drainage Procedure Note  Verbal consent obtained. Discussed risks and benefit of procedure. Patient agreed with proceeding.  Anaerobic culture collected from pus from draining abscess. 5cc of 1% lidocaine via 25G needle administered to the draining abscesses located in R underarm. Pus  expressed via opening made with lidocaine needle until no longer fluctuant. Vaseline ointment applied around wound opening to prevent friction. Absorbent non-adhesive dressing applied to R underarm. Antibiotics and pain medications per orders. Pt given after care instructions and given post-drainage precautions.    Lab Orders         Aerobic Culture w Gram Stain (superficial specimen)    Meds ordered this encounter  Medications   lidocaine (PF) (XYLOCAINE) 1 % injection 10 mL   white petrolatum (VASELINE) gel   sulfamethoxazole-trimethoprim (BACTRIM DS) 800-160 MG tablet    Sig: Take 1 tablet by mouth 2 (two) times daily for 14 days.    Dispense:  28 tablet    Refill:  0   chlorhexidine (HIBICLENS) 4 % external liquid    Sig: Apply topically in the morning and at bedtime.    Dispense:  473 mL    Refill:  0   HYDROcodone-acetaminophen (NORCO/VICODIN) 5-325 MG per tablet 2 tablet   HYDROcodone-acetaminophen (NORCO) 10-325 MG tablet    Sig: Take 1 tablet by mouth every 4 (four) hours as needed for severe pain.    Dispense:  15 tablet    Refill:  0   Imaging Orders  No imaging studies ordered today    MDM moderate  Assessment and Plan  Hidradenitis suppurativa Flare [redacted] weeks gestation Fetal Well being As above  Patient here with hidradenitis suppurativa flareup, in the right underarm.  She had 2 actively draining nodules, a total of 3-4 in her underarm.  She recently completed clindamycin for about a week, but her flareup came back.  Noted area of erythema extending from top nodule into the medial right arm.  Concern for cellulitis formation.  Completed incision and drainage of two actively draining  nodules.  Started patient on Bactrim for 14 days, Hibiclens, Norco for pain.  Patient has visit with OB tomorrow.  Gave return and preterm labor precautions.  Follow-up with primary OB.  Dispo: discharged to home in stable condition.  Discharge Instructions     Discharge patient   Complete by: As directed    Discharge disposition: 01-Home or Self Care   Discharge patient date: 09/08/2022      Allergies as of 09/08/2022       Reactions   Amoxicillin    "It does something to my liver enzymes and my skin turns yellow."        Medication List     TAKE these medications    acetaminophen 500 MG tablet Commonly known as: TYLENOL Take 500 mg by mouth daily as needed for mild pain or moderate pain.   aspirin EC 81 MG tablet Take 1 tablet (81 mg total) by mouth daily. Swallow whole.   benzonatate 200 MG capsule Commonly known as: TESSALON Take 1 capsule (200 mg total) by mouth 3 (three) times daily as needed for cough.   chlorhexidine 4 % external liquid Commonly known as: HIBICLENS Apply topically in the morning and at bedtime.   fluticasone 44 MCG/ACT inhaler Commonly known as: FLOVENT HFA Inhale 2 puffs into the lungs 2 (two) times daily.   guaiFENesin-codeine 100-10 MG/5ML syrup Take 10 mLs by mouth every 6 (six) hours as needed for cough.   HYDROcodone-acetaminophen 10-325 MG tablet Commonly known as: NORCO Take 1 tablet by mouth every 4 (four) hours as needed for severe pain.   montelukast 10 MG tablet Commonly known as: SINGULAIR Take 10 mg by mouth daily.   prenatal multivitamin Tabs tablet Take 1  tablet by mouth at bedtime.   promethazine 12.5 MG tablet Commonly known as: PHENERGAN Take 1 tablet (12.5 mg total) by mouth every 6 (six) hours as needed for nausea or vomiting.   sulfamethoxazole-trimethoprim 800-160 MG tablet Commonly known as: BACTRIM DS Take 1 tablet by mouth 2 (two) times daily for 14 days.        Myrtie Hawk, DO FMOB  Fellow, Faculty practice [facility], Center for [facility] 09/08/22  8:17 PM

## 2022-09-10 ENCOUNTER — Telehealth (INDEPENDENT_AMBULATORY_CARE_PROVIDER_SITE_OTHER): Payer: Managed Care, Other (non HMO) | Admitting: Obstetrics and Gynecology

## 2022-09-10 VITALS — BP 116/79

## 2022-09-10 DIAGNOSIS — O09299 Supervision of pregnancy with other poor reproductive or obstetric history, unspecified trimester: Secondary | ICD-10-CM

## 2022-09-10 DIAGNOSIS — Z3A20 20 weeks gestation of pregnancy: Secondary | ICD-10-CM

## 2022-09-10 DIAGNOSIS — O0990 Supervision of high risk pregnancy, unspecified, unspecified trimester: Secondary | ICD-10-CM

## 2022-09-10 DIAGNOSIS — O09292 Supervision of pregnancy with other poor reproductive or obstetric history, second trimester: Secondary | ICD-10-CM

## 2022-09-10 NOTE — Progress Notes (Signed)
    TELEHEALTH OBSTETRICS VISIT ENCOUNTER NOTE  Provider location: [facility] for [facility] [place]   Patient location: Home  I connected with [employer] on 09/10/22 at  1:50 PM EST by telephone at home and verified that I am speaking with the correct person using two identifiers. Of note, unable to do video encounter due to technical difficulties.    I discussed the limitations, risks, security and privacy concerns of performing an evaluation and management service by telephone and the availability of in person appointments. I also discussed with the patient that there may be a patient responsible charge related to this service. The patient expressed understanding and agreed to proceed.  Subjective:  Felicia [name] is a 32 y.o. [id number] at [redacted]w[redacted]d being followed for ongoing prenatal care.  She is currently monitored for the following issues for this high-risk pregnancy and has History of macrosomia in infant in prior pregnancy, currently pregnant; Obesity affecting pregnancy, antepartum; Supervision of high risk pregnancy, antepartum; History of fetal anomaly in prior pregnancy, currently pregnant; and Hidradenitis suppurativa on their problem list.  Patient reports no complaints. Reports fetal movement. Denies any contractions, bleeding or leaking of fluid.   The following portions of the patient's history were reviewed and updated as appropriate: allergies, current medications, past family history, past medical history, past social history, past surgical history and problem list.   Objective:  Blood pressure 116/79, last menstrual period 04/17/2022, unknown if currently breastfeeding. General:  Alert, oriented and cooperative.   Mental Status: Normal mood and affect perceived. Normal judgment and thought content.  Rest of physical exam deferred due to type of encounter  Assessment and Plan:  Pregnancy: [id number] at [redacted]w[redacted]d  1. Supervision of high risk pregnancy,  antepartum  BP good today.   2. History of macrosomia in infant in prior pregnancy, currently pregnant  History of macrosomia; MFM recommends early 2 hour GTT.  She will come in this week to do this.  Virtual visit today due to state of emergency weather conditions.  1/19 follow up with MFM.   Term labor symptoms and general obstetric precautions including but not limited to vaginal bleeding, contractions, leaking of fluid and fetal movement were reviewed in detail with the patient.  I discussed the assessment and treatment plan with the patient. The patient was provided an opportunity to ask questions and all were answered. The patient agreed with the plan and demonstrated an understanding of the instructions. The patient was advised to call back or seek an in-person office evaluation/go to MAU at [facility] for any urgent or concerning symptoms. Please refer to After Visit Summary for other counseling recommendations.   I provided 10 minutes of non-face-to-face time during this encounter.  Return Schedule 2 hour GTT this week. Early testing. then OB visit in 4 weeks..  Future Appointments  Date Time Provider [place]  09/10/2022  1:50 PM Nikeria Kalman, Artist Pais, NP [facility]  09/20/2022  9:45 AM [facility] NURSE [facility]  09/20/2022 10:00 AM [facility] US1 [facility]  10/08/2022  9:30 AM Julianne Handler, CNM [facility]    Noni Saupe, NP Center for [employer]

## 2022-09-12 LAB — AEROBIC CULTURE W GRAM STAIN (SUPERFICIAL SPECIMEN)

## 2022-09-13 ENCOUNTER — Ambulatory Visit: Payer: Managed Care, Other (non HMO)

## 2022-09-16 ENCOUNTER — Ambulatory Visit: Payer: Managed Care, Other (non HMO)

## 2022-09-16 DIAGNOSIS — O0990 Supervision of high risk pregnancy, unspecified, unspecified trimester: Secondary | ICD-10-CM

## 2022-09-16 DIAGNOSIS — O0992 Supervision of high risk pregnancy, unspecified, second trimester: Secondary | ICD-10-CM

## 2022-09-16 DIAGNOSIS — Z3A21 21 weeks gestation of pregnancy: Secondary | ICD-10-CM

## 2022-09-16 NOTE — Progress Notes (Signed)
Pt here to pick up lab slip to take for early 2 hour GTT.

## 2022-09-17 LAB — GLUCOSE TOLERANCE, 2 HOURS W/ 1HR
Glucose, 1 hour: 115 mg/dL (ref 70–179)
Glucose, 2 hour: 110 mg/dL (ref 70–152)
Glucose, Fasting: 80 mg/dL (ref 70–91)

## 2022-09-20 ENCOUNTER — Ambulatory Visit: Payer: Managed Care, Other (non HMO) | Admitting: *Deleted

## 2022-09-20 ENCOUNTER — Other Ambulatory Visit: Payer: Self-pay | Admitting: *Deleted

## 2022-09-20 ENCOUNTER — Ambulatory Visit: Payer: Managed Care, Other (non HMO) | Attending: Maternal & Fetal Medicine

## 2022-09-20 VITALS — BP 123/68 | HR 75

## 2022-09-20 DIAGNOSIS — Z362 Encounter for other antenatal screening follow-up: Secondary | ICD-10-CM | POA: Insufficient documentation

## 2022-09-20 DIAGNOSIS — O99212 Obesity complicating pregnancy, second trimester: Secondary | ICD-10-CM

## 2022-09-20 DIAGNOSIS — E669 Obesity, unspecified: Secondary | ICD-10-CM

## 2022-09-20 DIAGNOSIS — O09299 Supervision of pregnancy with other poor reproductive or obstetric history, unspecified trimester: Secondary | ICD-10-CM

## 2022-09-20 DIAGNOSIS — Z3A22 22 weeks gestation of pregnancy: Secondary | ICD-10-CM

## 2022-09-20 DIAGNOSIS — Z8279 Family history of other congenital malformations, deformations and chromosomal abnormalities: Secondary | ICD-10-CM

## 2022-09-20 DIAGNOSIS — O09292 Supervision of pregnancy with other poor reproductive or obstetric history, second trimester: Secondary | ICD-10-CM

## 2022-10-08 ENCOUNTER — Ambulatory Visit (INDEPENDENT_AMBULATORY_CARE_PROVIDER_SITE_OTHER): Payer: Managed Care, Other (non HMO) | Admitting: Certified Nurse Midwife

## 2022-10-08 VITALS — BP 125/70 | HR 76 | Wt 289.0 lb

## 2022-10-08 DIAGNOSIS — O99210 Obesity complicating pregnancy, unspecified trimester: Secondary | ICD-10-CM

## 2022-10-08 DIAGNOSIS — O99212 Obesity complicating pregnancy, second trimester: Secondary | ICD-10-CM

## 2022-10-08 DIAGNOSIS — L732 Hidradenitis suppurativa: Secondary | ICD-10-CM

## 2022-10-08 DIAGNOSIS — O0990 Supervision of high risk pregnancy, unspecified, unspecified trimester: Secondary | ICD-10-CM

## 2022-10-08 DIAGNOSIS — Z3A24 24 weeks gestation of pregnancy: Secondary | ICD-10-CM

## 2022-10-08 NOTE — Progress Notes (Signed)
Subjective:  Felicia [name] is a 32 y.o. [id number] at [redacted]w[redacted]d being seen today for ongoing prenatal care.  She is currently monitored for the following issues for this high-risk pregnancy and has History of macrosomia in infant in prior pregnancy, currently pregnant; Obesity affecting pregnancy, antepartum; Supervision of high risk pregnancy, antepartum; History of fetal anomaly in prior pregnancy, currently pregnant; and Hidradenitis suppurativa on their problem list.  Patient reports no complaints. Was seen in MAU for hydradenitis and given abx, reports improvement Contractions: Not present. Vag. Bleeding: None.  Movement: Present. Denies leaking of fluid.   The following portions of the patient's history were reviewed and updated as appropriate: allergies, current medications, past family history, past medical history, past social history, past surgical history and problem list. Problem list updated.  Objective:   Vitals:   10/08/22 0930  BP: 125/70  Pulse: 76  Weight: 289 lb (131.1 kg)    Fetal Status: Fetal Heart Rate (bpm): 148 Fundal Height: 24 cm Movement: Present  Presentation: Undeterminable  General:  Alert, oriented and cooperative. Patient is in no acute distress.  Skin: Skin is warm and dry. No rash noted.   Cardiovascular: Normal heart rate noted  Respiratory: Normal respiratory effort, no problems with respiration noted  Abdomen: Soft, gravid, appropriate for gestational age. Pain/Pressure: Absent     Pelvic: Vag. Bleeding: None Vag D/C Character: Thin   Cervical exam deferred        Extremities: Normal range of motion.  Edema: None  Mental Status: Normal mood and affect. Normal behavior. Normal judgment and thought content.   Urinalysis:      Assessment and Plan:  Pregnancy: [id number] at [redacted]w[redacted]d  1. Obesity affecting pregnancy, antepartum, unspecified obesity type - [place] and ante testing planned  2. [redacted] weeks gestation of pregnancy  3. Supervision of high risk pregnancy,  antepartum  4. Hidradenitis suppurativa - improving - continue moist heat and hibiclens  Preterm labor symptoms and general obstetric precautions including but not limited to vaginal bleeding, contractions, leaking of fluid and fetal movement were reviewed in detail with the patient. Please refer to After Visit Summary for other counseling recommendations.  Return in about 4 weeks (around 11/05/2022).   Julianne Handler, CNM

## 2022-10-20 ENCOUNTER — Encounter (HOSPITAL_COMMUNITY): Payer: Self-pay | Admitting: Obstetrics & Gynecology

## 2022-10-20 ENCOUNTER — Inpatient Hospital Stay (HOSPITAL_COMMUNITY)
Admission: AD | Admit: 2022-10-20 | Discharge: 2022-10-21 | Disposition: A | Discharge status: Home / Self Care | Payer: Managed Care, Other (non HMO) | Attending: Obstetrics & Gynecology | Admitting: Obstetrics & Gynecology

## 2022-10-20 DIAGNOSIS — Z3A26 26 weeks gestation of pregnancy: Secondary | ICD-10-CM | POA: Diagnosis not present

## 2022-10-20 DIAGNOSIS — O26892 Other specified pregnancy related conditions, second trimester: Secondary | ICD-10-CM | POA: Insufficient documentation

## 2022-10-20 DIAGNOSIS — R102 Pelvic and perineal pain: Secondary | ICD-10-CM | POA: Diagnosis not present

## 2022-10-20 DIAGNOSIS — R12 Heartburn: Secondary | ICD-10-CM | POA: Diagnosis not present

## 2022-10-20 NOTE — MAU Provider Note (Incomplete)
History     CSN: [id number]  Arrival date and time: 10/20/22 2218   None     Chief Complaint  Patient presents with  . Pelvic Pain   HPI Felicia [name] is a 32 y.o. [id number] at 59w4dwho presents to MAU for pelvic pressure and back pain. She reports pelvic pressure since 1930 this evening which is aggravated with standing. She also reports some achy low back pain since yesterday but reports this is normal for pregnancy. She denies contractions, vaginal bleeding, or leaking fluid. No urinary s/s, itching, or odor. She also reports some decreased fetal movement today however since arrival she is feeling normal movement.   [facility].  OB History     Gravida  5   Para  3   Term  3   Preterm      AB  1   Living  3      SAB  1   IAB      Ectopic      Multiple  0   Live Births  3           Past Medical History:  Diagnosis Date  . Asthma   . Hidradenitis suppurativa     Past Surgical History:  Procedure Laterality Date  . CHOLECYSTECTOMY  2011  . DILATION AND EVACUATION N/A 03/07/2015   Procedure: DILATATION AND EVACUATION;  Surgeon: MOlga Millers MD;  [facility];  Service: Gynecology;  Laterality: N/A;    Family History  Problem Relation Age of Onset  . Diabetes Mother   . Hypertension Father     Social History   Tobacco Use  . Smoking status: Never  . Smokeless tobacco: Never  Vaping Use  . Vaping Use: Never used  Substance Use Topics  . Alcohol use: No  . Drug use: No    Allergies:  Allergies  Allergen Reactions  . Amoxicillin     "It does something to my liver enzymes and my skin turns yellow."    Medications Prior to Admission  Medication Sig Dispense Refill Last Dose  . aspirin EC 81 MG tablet Take 1 tablet (81 mg total) by mouth daily. Swallow whole. 30 tablet 12 10/20/2022  . fluticasone (FLOVENT HFA) 44 MCG/ACT inhaler Inhale 2 puffs into the lungs 2 (two) times daily. 1  each 2 10/20/2022  . Prenatal Vit-Fe Fumarate-FA (PRENATAL MULTIVITAMIN) TABS tablet Take 1 tablet by mouth at bedtime.   10/20/2022  . acetaminophen (TYLENOL) 500 MG tablet Take 500 mg by mouth daily as needed for mild pain or moderate pain.      Review of Systems  Constitutional: Negative.   Genitourinary:  Positive for pelvic pain.  Musculoskeletal:  Positive for back pain.  All other systems reviewed and are negative.  Physical Exam  Patient Vitals for the past 24 hrs:  BP Temp Pulse Resp Height Weight  10/20/22 2236 122/73 -- -- -- -- --  10/20/22 2231 (!) 147/70 97.9 F (36.6 C) 84 18 5' 7"$  (1.702 m) 133.4 kg   Physical Exam Vitals and nursing note reviewed. Exam conducted with a chaperone present.  Constitutional:      General: She is not in acute distress.    Appearance: She is obese.  Eyes:     Extraocular Movements: Extraocular movements intact.     Pupils: Pupils are equal, round, and reactive to light.  Cardiovascular:     Rate and Rhythm: Normal  rate.  Pulmonary:     Effort: Pulmonary effort is normal. No respiratory distress.  Abdominal:     Palpations: Abdomen is soft.     Tenderness: There is no abdominal tenderness.  Genitourinary:    Comments: Gravid  Musculoskeletal:        General: Normal range of motion.     Cervical back: Normal range of motion.  Skin:    General: Skin is warm and dry.  Neurological:     General: No focal deficit present.     Mental Status: She is alert and oriented to person, place, and time.  Psychiatric:        Mood and Affect: Mood normal.        Behavior: Behavior normal.    Dilation: Closed Effacement (%): Thick Exam by:: Maryagnes Amos, CNM.  NST FHR: 145 bpm, moderate variability, +10x10 accels, no decels Toco: quiet  No results found for this or any previous visit (from the past 24 hour(s)).  MAU Course  Procedures  MDM UA NST  ***  Assessment and Plan  ***  Renee Harder 10/21/2022, 12:04 AM

## 2022-10-20 NOTE — MAU Provider Note (Signed)
History     CSN: [id number]  Arrival date and time: 10/20/22 2218   None     Chief Complaint  Patient presents with   Pelvic Pain   HPI Felicia [name] is a 32 y.o. [id number] at 57w4dwho presents to MAU for pelvic pressure and back pain. She reports pelvic pressure since 1930 this evening which is aggravated with standing. She also reports some achy low back pain since yesterday but reports this is normal for pregnancy. She denies contractions, vaginal bleeding, or leaking fluid. No urinary s/s, itching, or odor. She also reports some decreased fetal movement today however since arrival she is feeling normal movement.   [facility] next appointment is on 3[facility].  OB History     Gravida  5   Para  3   Term  3   Preterm      AB  1   Living  3      SAB  1   IAB      Ectopic      Multiple  0   Live Births  3           Past Medical History:  Diagnosis Date   Asthma    Hidradenitis suppurativa     Past Surgical History:  Procedure Laterality Date   CHOLECYSTECTOMY  2011   DILATION AND EVACUATION N/A 03/07/2015   Procedure: DILATATION AND EVACUATION;  Surgeon: MOlga Millers MD;  [facility];  Service: Gynecology;  Laterality: N/A;    Family History  Problem Relation Age of Onset   Diabetes Mother    Hypertension Father     Social History   Tobacco Use   Smoking status: Never   Smokeless tobacco: Never  Vaping Use   Vaping Use: Never used  Substance Use Topics   Alcohol use: No   Drug use: No    Allergies:  Allergies  Allergen Reactions   Amoxicillin     "It does something to my liver enzymes and my skin turns yellow."    No medications prior to admission.   Review of Systems  Constitutional: Negative.   Genitourinary:  Positive for pelvic pain.  Musculoskeletal:  Positive for back pain.  All other systems reviewed and are negative.  Physical Exam  Patient Vitals for the past 24 hrs:  BP Temp Pulse Resp  Height Weight  10/20/22 2236 122/73 -- -- -- -- --  10/20/22 2231 (!) 147/70 97.9 F (36.6 C) 84 18 '5\' 7"'$  (1.702 m) 133.4 kg   Physical Exam Vitals and nursing note reviewed. Exam conducted with a chaperone present.  Constitutional:      General: She is not in acute distress.    Appearance: She is obese.  Eyes:     Extraocular Movements: Extraocular movements intact.     Pupils: Pupils are equal, round, and reactive to light.  Cardiovascular:     Rate and Rhythm: Normal rate.  Pulmonary:     Effort: Pulmonary effort is normal. No respiratory distress.  Abdominal:     Palpations: Abdomen is soft.     Tenderness: There is no abdominal tenderness.  Genitourinary:    Comments: Gravid  Musculoskeletal:        General: Normal range of motion.     Cervical back: Normal range of motion.  Skin:    General: Skin is warm and dry.  Neurological:     General: No focal deficit present.     Mental  Status: She is alert and oriented to person, place, and time.  Psychiatric:        Mood and Affect: Mood normal.        Behavior: Behavior normal.    Dilation: Closed Effacement (%): Thick Exam by:: Maryagnes Amos, CNM.  NST FHR: 145 bpm, moderate variability, +10x10 accels, no decels Toco: quiet  Results for orders placed or performed during the hospital encounter of 10/20/22 (from the past 24 hour(s))  Urinalysis, Routine w reflex microscopic -Urine, Clean Catch     Status: Abnormal   Collection Time: 10/20/22 11:49 PM  Result Value Ref Range   Color, Urine YELLOW YELLOW   APPearance CLEAR CLEAR   Specific Gravity, Urine <1.005 (L) 1.005 - 1.030   pH 6.5 5.0 - 8.0   Glucose, UA NEGATIVE NEGATIVE mg/dL   Hgb urine dipstick NEGATIVE NEGATIVE   Bilirubin Urine NEGATIVE NEGATIVE   Ketones, ur NEGATIVE NEGATIVE mg/dL   Protein, ur NEGATIVE NEGATIVE mg/dL   Nitrite NEGATIVE NEGATIVE   Leukocytes,Ua TRACE (A) NEGATIVE  Urinalysis, Microscopic (reflex)     Status: Abnormal    Collection Time: 10/20/22 11:49 PM  Result Value Ref Range   RBC / HPF 0-5 0 - 5 RBC/hpf   WBC, UA 0-5 0 - 5 WBC/hpf   Bacteria, UA RARE (A) NONE SEEN   Squamous Epithelial / HPF 0-5 0 - 5 /HPF    MAU Course  Procedures  MDM UA NST Pepcid  UA reassuring. Cervix closed/thick. NST reassuring for gestational age, toco quiet. Patient offered Tylenol/Flexeril but declines.  1 single elevated BP due to cuff size. Subsequent BP's normotensive. Patient asymptomatic.  At the time of discharge, patient reported vomiting in the setting of heartburn. She is requesting medication for heartburn. Pepcid ordered. Will send prescription to her pharmacy.  Assessment and Plan   1. [redacted] weeks gestation of pregnancy   2. Pelvic pressure in pregnancy, antepartum, second trimester    - Discharge home in stable condition - Rx for Pepcid - Strict return precautions. Return to MAU as needed - Keep OB appointment as scheduled   Renee Harder, CNM 10/21/2022, 12:37 AM

## 2022-10-20 NOTE — MAU Note (Signed)
.  Felicia [name] is a 32 y.o. at 60w4dhere in MAU reporting: increased pelvic pressure this weekend. Alos c/o some back pain deneis ctx. Stated fetal movement a little less than usual all weekend but is feeling baby move.  LMP:  Onset of complaint: 2 days  Pain score: 5 Vitals:   10/20/22 2231 10/20/22 2236  BP: (!) 147/70 122/73  Pulse: 84   Resp: 18   Temp: 97.9 F (36.6 C)      FHT:140 Lab orders placed from triage:

## 2022-10-21 DIAGNOSIS — Z3A26 26 weeks gestation of pregnancy: Secondary | ICD-10-CM

## 2022-10-21 DIAGNOSIS — R102 Pelvic and perineal pain: Secondary | ICD-10-CM | POA: Diagnosis not present

## 2022-10-21 DIAGNOSIS — O26892 Other specified pregnancy related conditions, second trimester: Secondary | ICD-10-CM | POA: Diagnosis not present

## 2022-10-21 LAB — URINALYSIS, ROUTINE W REFLEX MICROSCOPIC
Bilirubin Urine: NEGATIVE
Glucose, UA: NEGATIVE mg/dL
Hgb urine dipstick: NEGATIVE
Ketones, ur: NEGATIVE mg/dL
Nitrite: NEGATIVE
Protein, ur: NEGATIVE mg/dL
Specific Gravity, Urine: <1.005 — ABNORMAL LOW (ref 1.005–1.030)
pH: 6.5 (ref 5.0–8.0)

## 2022-10-21 LAB — URINALYSIS, MICROSCOPIC (REFLEX)

## 2022-10-21 MED ORDER — FAMOTIDINE 20 MG PO TABS
20.0000 mg | ORAL_TABLET | Freq: Once | ORAL | Status: AC
  Administered 2022-10-21: 20 mg via ORAL
  Filled 2022-10-21: qty 1

## 2022-10-21 MED ORDER — FAMOTIDINE 20 MG PO TABS: 20.0000 mg | ORAL_TABLET | Freq: Once | ORAL | 0 refills | Status: DC

## 2022-11-01 ENCOUNTER — Ambulatory Visit: Payer: Managed Care, Other (non HMO) | Attending: Obstetrics and Gynecology

## 2022-11-01 ENCOUNTER — Other Ambulatory Visit: Payer: Self-pay | Admitting: Obstetrics

## 2022-11-01 ENCOUNTER — Ambulatory Visit: Payer: Managed Care, Other (non HMO) | Admitting: *Deleted

## 2022-11-01 VITALS — BP 116/59 | HR 79

## 2022-11-01 DIAGNOSIS — O99213 Obesity complicating pregnancy, third trimester: Secondary | ICD-10-CM

## 2022-11-01 DIAGNOSIS — O09299 Supervision of pregnancy with other poor reproductive or obstetric history, unspecified trimester: Secondary | ICD-10-CM

## 2022-11-01 DIAGNOSIS — Z3689 Encounter for other specified antenatal screening: Secondary | ICD-10-CM

## 2022-11-01 DIAGNOSIS — Z3A28 28 weeks gestation of pregnancy: Secondary | ICD-10-CM

## 2022-11-01 DIAGNOSIS — Z8279 Family history of other congenital malformations, deformations and chromosomal abnormalities: Secondary | ICD-10-CM

## 2022-11-01 DIAGNOSIS — O09293 Supervision of pregnancy with other poor reproductive or obstetric history, third trimester: Secondary | ICD-10-CM

## 2022-11-01 DIAGNOSIS — O99212 Obesity complicating pregnancy, second trimester: Secondary | ICD-10-CM | POA: Diagnosis present

## 2022-11-01 DIAGNOSIS — E669 Obesity, unspecified: Secondary | ICD-10-CM | POA: Diagnosis not present

## 2022-11-04 ENCOUNTER — Ambulatory Visit (INDEPENDENT_AMBULATORY_CARE_PROVIDER_SITE_OTHER): Payer: Managed Care, Other (non HMO) | Admitting: Obstetrics and Gynecology

## 2022-11-04 VITALS — BP 135/79 | HR 83 | Wt 292.0 lb

## 2022-11-04 DIAGNOSIS — O99891 Other specified diseases and conditions complicating pregnancy: Secondary | ICD-10-CM

## 2022-11-04 DIAGNOSIS — Z3A28 28 weeks gestation of pregnancy: Secondary | ICD-10-CM | POA: Diagnosis not present

## 2022-11-04 DIAGNOSIS — Z6841 Body Mass Index (BMI) 40.0 and over, adult: Secondary | ICD-10-CM

## 2022-11-04 DIAGNOSIS — L732 Hidradenitis suppurativa: Secondary | ICD-10-CM

## 2022-11-04 DIAGNOSIS — M549 Dorsalgia, unspecified: Secondary | ICD-10-CM

## 2022-11-04 DIAGNOSIS — O09299 Supervision of pregnancy with other poor reproductive or obstetric history, unspecified trimester: Secondary | ICD-10-CM

## 2022-11-04 DIAGNOSIS — O09293 Supervision of pregnancy with other poor reproductive or obstetric history, third trimester: Secondary | ICD-10-CM | POA: Diagnosis not present

## 2022-11-04 DIAGNOSIS — Z23 Encounter for immunization: Secondary | ICD-10-CM | POA: Diagnosis not present

## 2022-11-04 DIAGNOSIS — O0990 Supervision of high risk pregnancy, unspecified, unspecified trimester: Secondary | ICD-10-CM

## 2022-11-04 MED ORDER — FAMOTIDINE 20 MG PO TABS: 20.0000 mg | ORAL_TABLET | Freq: Two times a day (BID) | ORAL | 3 refills | Status: DC

## 2022-11-04 NOTE — Progress Notes (Signed)
   PRENATAL VISIT NOTE  Subjective:  Felicia [name] is a 32 y.o. [id number] at 7w5dbeing seen today for ongoing prenatal care.  She is currently monitored for the following issues for this low-risk pregnancy and has History of macrosomia in infant in prior pregnancy, currently pregnant; Obesity affecting pregnancy, antepartum; Supervision of high risk pregnancy, antepartum; History of fetal anomaly in prior pregnancy, currently pregnant; and Hidradenitis suppurativa on their problem list.  Patient reports backache.  Contractions: Not present. Vag. Bleeding: None.  Movement: Present. Denies leaking of fluid.   The following portions of the patient's history were reviewed and updated as appropriate: allergies, current medications, past family history, past medical history, past social history, past surgical history and problem list.   Objective:   Vitals:   11/04/22 0843  BP: 135/79  Pulse: 83  Weight: 292 lb (132.5 kg)   Fetal Status: Fetal Heart Rate (bpm): 149   Movement: Present    FH deferred given normal growth [place] General:  Alert, oriented and cooperative. Patient is in no acute distress.  Skin: Skin is warm and dry. No rash noted.   Cardiovascular: Normal heart rate noted  Respiratory: Normal respiratory effort, no problems with respiration noted  Abdomen: Soft, gravid, appropriate for gestational age.  Pain/Pressure: Absent      Assessment and Plan:  Pregnancy: G5P3013 at 254w5d. Supervision of high risk pregnancy, antepartum 2. [redacted] weeks gestation of pregnancy - Glucose Tolerance, 2 Hours w/1 Hour - HIV antibody (with reflex) - CBC - RPR - Tdap vaccine greater than or equal to 7yo IM  3. Back pain affecting pregnancy in third trimester Discussed OTC medications for management - Ambulatory referral to Physical Therapy  4. Hidradenitis suppurativa No current complaints  5. BMI 45.0-49.9, adult ([place]. History of fetal anomaly in prior pregnancy, currently  pregnant 7. History of macrosomia in infant in prior pregnancy, currently pregnant ldASA Normal anatomy [place] 3/1: '@28'$ /1: 1426g (89%), AC 90%, AFI 20.25, posterior, breech Next growth scheduled 4/12, plan for weekly BPPs started at 34 weeks  Please refer to After Visit Summary for other counseling recommendations.   Return in about 2 weeks (around 11/18/2022) for return OB at 30 weeks.  Future Appointments  Date Time Provider [place]/18/2024  8:50 AM LeGuss BundeMD [facility]4/08/2023  9:15 AM [facility] NURSE [facility]/08/2023  9:30 AM [facility] US3 [facility]4/19/2024  9:15 AM [facility] NURSE [facility]4/19/2024  9:30 AM [facility] US3 [facility]4/26/2024  9:15 AM [facility] NURSE [facility]4/26/2024  9:30 AM [facility] US3 [facility] WMW[place] KyInez CatalinaMD

## 2022-11-05 ENCOUNTER — Encounter: Payer: Self-pay | Admitting: Obstetrics and Gynecology

## 2022-11-05 DIAGNOSIS — D649 Anemia, unspecified: Secondary | ICD-10-CM | POA: Insufficient documentation

## 2022-11-05 LAB — GLUCOSE TOLERANCE, 2 HOURS W/ 1HR
Glucose, 1 hour: 139 mg/dL (ref 70–179)
Glucose, 2 hour: 133 mg/dL (ref 70–152)
Glucose, Fasting: 81 mg/dL (ref 70–91)

## 2022-11-05 LAB — RPR: RPR Ser Ql: NONREACTIVE

## 2022-11-05 LAB — CBC
Hematocrit: 32.5 % — ABNORMAL LOW (ref 34.0–46.6)
Hemoglobin: 10.7 g/dL — ABNORMAL LOW (ref 11.1–15.9)
MCH: 28.8 pg (ref 26.6–33.0)
MCHC: 32.9 g/dL (ref 31.5–35.7)
MCV: 88 fL (ref 79–97)
Platelets: 253 10*3/uL (ref 150–450)
RBC: 3.71 x10E6/uL — ABNORMAL LOW (ref 3.77–5.28)
RDW: 13.4 % (ref 11.7–15.4)
WBC: 11.8 10*3/uL — ABNORMAL HIGH (ref 3.4–10.8)

## 2022-11-05 LAB — HIV ANTIBODY (ROUTINE TESTING W REFLEX): HIV Screen 4th Generation wRfx: NONREACTIVE

## 2022-11-05 MED ORDER — FERROUS SULFATE 325 (65 FE) MG PO TBEC: 325.0000 mg | DELAYED_RELEASE_TABLET | ORAL | 1 refills | Status: DC

## 2022-11-05 NOTE — Addendum Note (Signed)
Addended by: Gale Journey on: 11/05/2022 10:38 PM   Modules accepted: Orders

## 2022-11-06 ENCOUNTER — Encounter: Payer: Self-pay | Admitting: Obstetrics and Gynecology

## 2022-11-12 ENCOUNTER — Ambulatory Visit: Payer: Managed Care, Other (non HMO) | Admitting: Physical Therapy

## 2022-11-18 ENCOUNTER — Ambulatory Visit: Payer: Managed Care, Other (non HMO) | Attending: Obstetrics and Gynecology | Admitting: Physical Therapy

## 2022-11-18 ENCOUNTER — Encounter: Payer: Self-pay | Admitting: Physical Therapy

## 2022-11-18 ENCOUNTER — Ambulatory Visit (INDEPENDENT_AMBULATORY_CARE_PROVIDER_SITE_OTHER): Payer: Managed Care, Other (non HMO) | Admitting: Obstetrics & Gynecology

## 2022-11-18 ENCOUNTER — Other Ambulatory Visit: Payer: Self-pay

## 2022-11-18 VITALS — BP 130/81 | HR 86 | Wt 295.0 lb

## 2022-11-18 DIAGNOSIS — O09299 Supervision of pregnancy with other poor reproductive or obstetric history, unspecified trimester: Secondary | ICD-10-CM

## 2022-11-18 DIAGNOSIS — O0990 Supervision of high risk pregnancy, unspecified, unspecified trimester: Secondary | ICD-10-CM

## 2022-11-18 DIAGNOSIS — M5459 Other low back pain: Secondary | ICD-10-CM | POA: Insufficient documentation

## 2022-11-18 DIAGNOSIS — O99891 Other specified diseases and conditions complicating pregnancy: Secondary | ICD-10-CM | POA: Diagnosis present

## 2022-11-18 DIAGNOSIS — M6281 Muscle weakness (generalized): Secondary | ICD-10-CM | POA: Diagnosis not present

## 2022-11-18 DIAGNOSIS — Z3A30 30 weeks gestation of pregnancy: Secondary | ICD-10-CM

## 2022-11-18 DIAGNOSIS — O09293 Supervision of pregnancy with other poor reproductive or obstetric history, third trimester: Secondary | ICD-10-CM

## 2022-11-18 NOTE — Therapy (Signed)
OUTPATIENT PHYSICAL THERAPY THORACOLUMBAR EVALUATION   Patient Name: Felicia [name] MRN: [id number] DOB:1990/11/21, 32 y.o., female Today's Date: 11/18/2022  END OF SESSION:  PT End of Session - 11/18/22 1136     Visit Number 1    Number of Visits 6    Date for PT Re-Evaluation 12/30/22    PT Start Time 1100    PT Stop Time 1130    PT Time Calculation (min) 30 min    Activity Tolerance Patient tolerated treatment well    Behavior During Therapy [facility] for tasks assessed/performed             Past Medical History:  Diagnosis Date   Asthma    Hidradenitis suppurativa    Past Surgical History:  Procedure Laterality Date   CHOLECYSTECTOMY  2011   DILATION AND EVACUATION N/A 03/07/2015   Procedure: DILATATION AND EVACUATION;  Surgeon: Olga Millers, MD;  Location: [facility];  Service: Gynecology;  Laterality: N/A;   Patient Active Problem List   Diagnosis Date Noted   Anemia 11/05/2022   Hidradenitis suppurativa 08/05/2022   History of macrosomia in infant in prior pregnancy, currently pregnant 07/09/2022   Obesity affecting pregnancy, antepartum 07/09/2022   Supervision of high risk pregnancy, antepartum 07/09/2022   History of fetal anomaly in prior pregnancy, currently pregnant 07/09/2022    PCP: none provided  REFERRING PROVIDER: Gale Journey  REFERRING DIAG: back pain in 3rd trimester of pregnancy  Rationale for Evaluation and Treatment: Rehabilitation  THERAPY DIAG:  Other low back pain  Muscle weakness (generalized)  ONSET DATE: 08/2023  SUBJECTIVE:                                                                                                                                                                                           SUBJECTIVE STATEMENT: Pt states she has been having bilateral hip and back pain since about December. She is currently pregnant and due May 22 (31 weeks). Pain increases with increased standing and walking, eases with rest  and tylenol.  PERTINENT HISTORY:  4th pregnancy, first instance of this type of pain  PAIN:  Are you having pain? Yes: NPRS scale: 4/10 currently, 9/10 at worst/10 Pain location: bilateral low back and hips Pain description: achey Aggravating factors: prolonged standing and walking Relieving factors: rest, meds  PRECAUTIONS: Other: 3rd trimester pregnancy  WEIGHT BEARING RESTRICTIONS: No  FALLS:  Has patient fallen in last 6 months? No  LIVING ENVIRONMENT: Lives with: lives with their family   OCCUPATION: stay at home mom  PLOF: Independent  PATIENT GOALS: decrease pain  NEXT MD VISIT: 12/02/22  OBJECTIVE:  POSTURE: decreased lumbar lordosis  PALPATION: TTP bilat piriformis and glutes  LOWER EXTREMITY ROM:     Decreased hamstring and piriformis length bilat  LOWER EXTREMITY MMT:    MMT Right eval Left eval  Hip flexion 4+ 4  Hip extension 4 4-  Hip abduction 4 4-  Hip adduction    Hip internal rotation    Hip external rotation    Knee flexion    Knee extension    Ankle dorsiflexion    Ankle plantarflexion    Ankle inversion    Ankle eversion     (Blank rows = not tested)  TODAY'S TREATMENT:                                                                                                                              DATE: See HEP    PATIENT EDUCATION:  Education details: PT POC and goals, HEP Person educated: Patient Education method: Explanation, Demonstration, and Handouts Education comprehension: verbalized understanding and returned demonstration  HOME EXERCISE PROGRAM: Access Code: [email or web address] URL: [email or web address] Date: 11/18/2022 Prepared by: Isabelle Course  Exercises - Seated Figure 4 Piriformis Stretch  - 1 x daily - 7 x weekly - 1 sets - 3 reps - 20-30 seconds hold - Seated Hamstring Stretch on Swiss Ball  - 1 x daily - 7 x weekly - 1 sets - 3 reps - 20-30seconds hold - Seated Lateral Trunk Stretch on Swiss Ball   - 1 x daily - 7 x weekly - 1 sets - 3 reps - 20-30 seconds hold - Quadruped Cat Cow  - 1 x daily - 7 x weekly - 2 sets - 10 reps - Quadruped Hip Extension Kicks  - 1 x daily - 7 x weekly - 2 sets - 10 reps - Seated Hip Adduction Isometrics with Ball  - 1 x daily - 7 x weekly - 2 sets - 10 reps - Seated Hip Abduction with Resistance  - 1 x daily - 7 x weekly - 2 sets - 10 reps - Warrior II  - 1 x daily - 7 x weekly - 1 sets - 10 reps - 10 seconds hold  ASSESSMENT:  CLINICAL IMPRESSION: Patient is a 32 y.o. female who was seen today for physical therapy evaluation and treatment for back pain in 3rd trimester of pregnancy. Pt presents with decreased muscle length, increased pain, decreased activity tolerance and decreased strength. She will benefit from skilled PT to address deficits and improve functional mobility with decreased pain.   OBJECTIVE IMPAIRMENTS: decreased activity tolerance, decreased mobility, decreased strength, and pain.   ACTIVITY LIMITATIONS: standing, sleeping, and locomotion level  PARTICIPATION LIMITATIONS: meal prep, shopping, and community activity  PERSONAL FACTORS: 1 comorbidity: pregnancy  are also affecting patient's functional outcome.   REHAB POTENTIAL: Good  CLINICAL DECISION MAKING: Stable/uncomplicated  EVALUATION COMPLEXITY: Low   GOALS: Goals reviewed with patient? Yes  SHORT TERM GOALS: Target date: 12/02/2022  Pt will be independent in initial HEP Baseline: Goal status: INITIAL   LONG TERM GOALS: Target date: 12/30/2022    Pt will tolerate standing x 30 minutes with pain <= 3/10 Baseline:  Goal status: INITIAL  2.  Pt will be independent with advanced HEP Baseline:  Goal status: INITIAL  3.  Pt will report pain at rest <=1/10 in hips and back Baseline:  Goal status: INITIAL   PLAN:  PT FREQUENCY: 1x/week  PT DURATION: 6 weeks  PLANNED INTERVENTIONS: Therapeutic exercises, Therapeutic activity, Neuromuscular re-education,  Balance training, Gait training, Patient/Family education, Self Care, Joint mobilization, Aquatic Therapy, Cryotherapy, Moist heat, Taping, Manual therapy, and Re-evaluation.  PLAN FOR NEXT SESSION: assess response to HEP, core and hip strength   Tristin Gladman, PT 11/18/2022, 11:37 AM

## 2022-11-18 NOTE — Progress Notes (Signed)
   PRENATAL VISIT NOTE  Subjective:  Felicia [name] is a 32 y.o. [id number] at [redacted]w[redacted]d being seen today for ongoing prenatal care.  She is currently monitored for the following issues for this high-risk pregnancy and has History of macrosomia in infant in prior pregnancy, currently pregnant; Obesity affecting pregnancy, antepartum; Supervision of high risk pregnancy, antepartum; History of fetal anomaly in prior pregnancy, currently pregnant; Hidradenitis suppurativa; and Anemia on their problem list.  Patient reports fatigue.  Contractions: Not present. Vag. Bleeding: None.  Movement: Present. Denies leaking of fluid.   The following portions of the patient's history were reviewed and updated as appropriate: allergies, current medications, past family history, past medical history, past social history, past surgical history and problem list.   Objective:   Vitals:   11/18/22 0901  BP: 130/81  Pulse: 86  Weight: 133.8 kg    Fetal Status: Fetal Heart Rate (bpm): 152   Movement: Present     General:  Alert, oriented and cooperative. Patient is in no acute distress.  Skin: Skin is warm and dry. No rash noted.   Cardiovascular: Normal heart rate noted  Respiratory: Normal respiratory effort, no problems with respiration noted  Abdomen: Soft, gravid, appropriate for gestational age.  Pain/Pressure: Absent     Pelvic: Cervical exam deferred        Extremities: Normal range of motion.  Edema: None  Mental Status: Normal mood and affect. Normal behavior. Normal judgment and thought content.   Assessment and Plan:  Pregnancy: [id number] at [redacted]w[redacted]d 1. Supervision of high risk pregnancy, antepartum  2. History of macrosomia in infant in prior pregnancy, currently pregnant Largest 9lb 8 oz in [place]; serial growth [place] and antenatal testing (BMI 46)  3.  Lightheaded when changing position/orthostatic hypotension Urine is yellow to dark yellow--increase fluids. Will check if better next visit.     Preterm labor symptoms and general obstetric precautions including but not limited to vaginal bleeding, contractions, leaking of fluid and fetal movement were reviewed in detail with the patient. Please refer to After Visit Summary for other counseling recommendations.   No follow-ups on file.  Future Appointments  Date Time Provider [place]  11/18/2022 11:00 AM Kennith Gain, PT [facility]  12/02/2022 11:10 AM Guss Bunde, MD [facility]  12/13/2022  9:15 AM [facility] NURSE [facility]  12/13/2022  9:30 AM [facility] US3 [facility]  12/16/2022 10:30 AM Guss Bunde, MD [facility]  12/20/2022  9:15 AM [facility] NURSE [facility]  12/20/2022  9:30 AM [facility] US3 [facility]  12/27/2022  9:15 AM [facility] NURSE [facility]  12/27/2022  9:30 AM [facility] US3 [facility]  12/30/2022  9:50 AM Inez Catalina, MD [facility]  01/06/2023  9:50 AM Guss Bunde, MD [facility]  01/13/2023  9:30 AM Guss Bunde, MD [facility]  01/20/2023  9:50 AM Gala Romney, Fredderick Phenix, MD [facility]    Silas [place], MD

## 2022-11-22 ENCOUNTER — Other Ambulatory Visit: Payer: Self-pay

## 2022-11-22 ENCOUNTER — Encounter: Payer: Self-pay | Admitting: Obstetrics & Gynecology

## 2022-12-02 ENCOUNTER — Ambulatory Visit (INDEPENDENT_AMBULATORY_CARE_PROVIDER_SITE_OTHER): Payer: Managed Care, Other (non HMO) | Admitting: Obstetrics & Gynecology

## 2022-12-02 VITALS — BP 134/71 | HR 82 | Wt 303.0 lb

## 2022-12-02 DIAGNOSIS — O09299 Supervision of pregnancy with other poor reproductive or obstetric history, unspecified trimester: Secondary | ICD-10-CM

## 2022-12-02 DIAGNOSIS — O99013 Anemia complicating pregnancy, third trimester: Secondary | ICD-10-CM

## 2022-12-02 DIAGNOSIS — O368131 Decreased fetal movements, third trimester, fetus 1: Secondary | ICD-10-CM

## 2022-12-02 DIAGNOSIS — O09293 Supervision of pregnancy with other poor reproductive or obstetric history, third trimester: Secondary | ICD-10-CM

## 2022-12-02 DIAGNOSIS — O0990 Supervision of high risk pregnancy, unspecified, unspecified trimester: Secondary | ICD-10-CM

## 2022-12-02 DIAGNOSIS — D649 Anemia, unspecified: Secondary | ICD-10-CM

## 2022-12-02 DIAGNOSIS — Z3A32 32 weeks gestation of pregnancy: Secondary | ICD-10-CM | POA: Diagnosis not present

## 2022-12-02 NOTE — Progress Notes (Signed)
   PRENATAL VISIT NOTE  Subjective:  Felicia [name] is a 32 y.o. [id number] at [redacted]w[redacted]d being seen today for ongoing prenatal care.  She is currently monitored for the following issues for this high-risk pregnancy and has History of macrosomia in infant in prior pregnancy, currently pregnant; Obesity affecting pregnancy, antepartum; Supervision of high risk pregnancy, antepartum; History of fetal anomaly in prior pregnancy, currently pregnant; Hidradenitis suppurativa; and Anemia on their problem list.  Patient reports  decreased fetal movement .  Contractions: Not present. Vag. Bleeding: None.  Movement: Present. Denies leaking of fluid.   The following portions of the patient's history were reviewed and updated as appropriate: allergies, current medications, past family history, past medical history, past social history, past surgical history and problem list.   Objective:   Vitals:   12/02/22 1115  BP: 134/71  Pulse: 82  Weight: (!) 137.4 kg    Fetal Status: Fetal Heart Rate (bpm): 146   Movement: Present     General:  Alert, oriented and cooperative. Patient is in no acute distress.  Skin: Skin is warm and dry. No rash noted.   Cardiovascular: Normal heart rate noted  Respiratory: Normal respiratory effort, no problems with respiration noted  Abdomen: Soft, gravid, appropriate for gestational age.  Pain/Pressure: Absent     Pelvic: Cervical exam deferred        Extremities: Normal range of motion.  Edema: None  Mental Status: Normal mood and affect. Normal behavior. Normal judgment and thought content.   Assessment and Plan:  Pregnancy: [id number] at [redacted]w[redacted]d 1. Anemia, unspecified type Check CBC at 34 week visit  2. Supervision of high risk pregnancy, antepartum Decreased fetal movement.  NST today is reactive.  Fetal kick counts reviewed. Baseline:  140 Accelerations:  present Decelerations:  absent Variability:  moderate Interpretation:  Reactive NST   3. History of macrosomia  in infant in prior pregnancy, currently pregnant Serial growth [place]  Preterm labor symptoms and general obstetric precautions including but not limited to vaginal bleeding, contractions, leaking of fluid and fetal movement were reviewed in detail with the patient. Please refer to After Visit Summary for other counseling recommendations.   No follow-ups on file.  Future Appointments  Date Time Provider [place]  12/13/2022  9:15 AM [facility] NURSE [facility]  12/13/2022  9:30 AM [facility] US3 [facility]  12/16/2022 10:30 AM Guss Bunde, MD [facility]  12/20/2022  9:30 AM [facility] NURSE [facility]  12/20/2022  9:45 AM [facility] NST [facility]  12/27/2022  9:15 AM W[facility] NURSE [facility] [place]  12/27/2022  9:30 AM [facility] US3 [facility]  12/30/2022  9:50 AM Inez Catalina, MD [facility]  01/06/2023  9:50 AM Guss Bunde, MD [facility]  01/13/2023  9:30 AM Guss Bunde, MD [facility]  01/20/2023  9:50 AM Gala Romney, Fredderick Phenix, MD [facility]    Silas [place], MD

## 2022-12-09 ENCOUNTER — Telehealth: Payer: Self-pay | Admitting: *Deleted

## 2022-12-09 ENCOUNTER — Encounter (HOSPITAL_COMMUNITY): Payer: Self-pay | Admitting: Family Medicine

## 2022-12-09 ENCOUNTER — Inpatient Hospital Stay (HOSPITAL_BASED_OUTPATIENT_CLINIC_OR_DEPARTMENT_OTHER): Payer: Managed Care, Other (non HMO)

## 2022-12-09 ENCOUNTER — Inpatient Hospital Stay (HOSPITAL_COMMUNITY)
Admission: AD | Admit: 2022-12-09 | Discharge: 2022-12-09 | Disposition: A | Discharge status: Home / Self Care | Payer: Managed Care, Other (non HMO) | Attending: Family Medicine | Admitting: Family Medicine

## 2022-12-09 DIAGNOSIS — W010XXA Fall on same level from slipping, tripping and stumbling without subsequent striking against object, initial encounter: Secondary | ICD-10-CM | POA: Insufficient documentation

## 2022-12-09 DIAGNOSIS — O26893 Other specified pregnancy related conditions, third trimester: Secondary | ICD-10-CM

## 2022-12-09 DIAGNOSIS — Y92002 Bathroom of unspecified non-institutional (private) residence single-family (private) house as the place of occurrence of the external cause: Secondary | ICD-10-CM | POA: Insufficient documentation

## 2022-12-09 DIAGNOSIS — O9A213 Injury, poisoning and certain other consequences of external causes complicating pregnancy, third trimester: Secondary | ICD-10-CM | POA: Diagnosis not present

## 2022-12-09 DIAGNOSIS — O99013 Anemia complicating pregnancy, third trimester: Secondary | ICD-10-CM | POA: Insufficient documentation

## 2022-12-09 DIAGNOSIS — Z3A33 33 weeks gestation of pregnancy: Secondary | ICD-10-CM

## 2022-12-09 DIAGNOSIS — Y93E1 Activity, personal bathing and showering: Secondary | ICD-10-CM | POA: Insufficient documentation

## 2022-12-09 DIAGNOSIS — W19XXXA Unspecified fall, initial encounter: Secondary | ICD-10-CM | POA: Diagnosis not present

## 2022-12-09 LAB — CBC
HCT: 32.4 % — ABNORMAL LOW (ref 36.0–46.0)
Hemoglobin: 10.9 g/dL — ABNORMAL LOW (ref 12.0–15.0)
MCH: 28.6 pg (ref 26.0–34.0)
MCHC: 33.6 g/dL (ref 30.0–36.0)
MCV: 85 fL (ref 80.0–100.0)
Platelets: 258 10*3/uL (ref 150–400)
RBC: 3.81 MIL/uL — ABNORMAL LOW (ref 3.87–5.11)
RDW: 14.1 % (ref 11.5–15.5)
WBC: 12.6 10*3/uL — ABNORMAL HIGH (ref 4.0–10.5)
nRBC: 0 % (ref 0.0–0.2)

## 2022-12-09 LAB — URINALYSIS, ROUTINE W REFLEX MICROSCOPIC
Bilirubin Urine: NEGATIVE
Glucose, UA: NEGATIVE mg/dL
Hgb urine dipstick: NEGATIVE
Ketones, ur: NEGATIVE mg/dL
Nitrite: NEGATIVE
Protein, ur: NEGATIVE mg/dL
Specific Gravity, Urine: 1.009 (ref 1.005–1.030)
pH: 6 (ref 5.0–8.0)

## 2022-12-09 NOTE — MAU Provider Note (Addendum)
History     CSN: [id number]  Arrival date and time: 12/09/22 1251   None     Chief Complaint  Patient presents with   Fall   Fall   Patient is a 32 yo [id number] female with an uncomplicated hx presenting to MAU after a fall earlier today. Patient has a been experiencing bouts of vertigo lasting approximately 2-3 mins. Patient was diagnosed with orthostatic hypotension and anemia by OB/GYN who is following pregnancy, most likely attributing to patients vertigo.  Patient was told by OB/Gyn to come in for an evaluation due to the fall.   Patient tripped while stepping out of the shower. She stated that she attempted to brace the fall and was able to land on her left side. Patient denies LOC, bleeding, or bruising.  FHR and strip is reassuring with variability.  OB History     Gravida  5   Para  3   Term  3   Preterm      AB  1   Living  3      SAB  1   IAB      Ectopic      Multiple  0   Live Births  3           Past Medical History:  Diagnosis Date   Asthma    Hidradenitis suppurativa     Past Surgical History:  Procedure Laterality Date   CHOLECYSTECTOMY  2011   DILATION AND EVACUATION N/A 03/07/2015   Procedure: DILATATION AND EVACUATION;  Surgeon: Levi AlandMark E Anderson, MD;  Location: [facility];  Service: Gynecology;  Laterality: N/A;    Family History  Problem Relation Age of Onset   Diabetes Mother    Hypertension Father     Social History   Tobacco Use   Smoking status: Never   Smokeless tobacco: Never  Vaping Use   Vaping Use: Never used  Substance Use Topics   Alcohol use: No   Drug use: No    Allergies:  Allergies  Allergen Reactions   Amoxicillin     "It does something to my liver enzymes and my skin turns yellow."    Medications Prior to Admission  Medication Sig Dispense Refill Last Dose   acetaminophen (TYLENOL) 500 MG tablet Take 500 mg by mouth daily as needed for mild pain or moderate pain.   12/09/2022   aspirin EC 81 MG  tablet Take 1 tablet (81 mg total) by mouth daily. Swallow whole. 30 tablet 12 12/09/2022   famotidine (PEPCID) 20 MG tablet Take 1 tablet (20 mg total) by mouth 2 (two) times daily. 60 tablet 3 12/09/2022   ferrous sulfate 325 (65 FE) MG EC tablet Take 1 tablet (325 mg total) by mouth every other day. 90 tablet 1 Past Week   fluticasone (FLOVENT HFA) 44 MCG/ACT inhaler Inhale 2 puffs into the lungs 2 (two) times daily. 1 each 2 12/09/2022   Prenatal Vit-Fe Fumarate-FA (PRENATAL MULTIVITAMIN) TABS tablet Take 1 tablet by mouth at bedtime.   12/09/2022    Review of Systems  Respiratory:  Negative for chest tightness, shortness of breath and wheezing.   Cardiovascular:  Positive for leg swelling. Negative for chest pain.    Physical Exam   Blood pressure 137/71, pulse (!) 101, temperature 98.9 F (37.2 C), resp. rate 18, last menstrual period 04/17/2022, unknown if currently breastfeeding.  Physical Exam Constitutional:      Appearance: Normal appearance.  HENT:     Head:  Normocephalic and atraumatic.  Cardiovascular:     Heart sounds: Normal heart sounds.  Pulmonary:     Effort: Pulmonary effort is normal.     Breath sounds: Normal breath sounds.  Musculoskeletal:     Cervical back: Normal range of motion and neck supple.  Skin:    General: Skin is warm and dry.  Neurological:     Mental Status: She is alert.      MAU Course  Procedures  MDM -No red flags elicited and patient appear stable.  - Due to patients fall an [place] and four hour watchful waiting period recommended - Patients dizziness is most likely attributed to Orthostatic hypotension. However, patient does anemia and CBC also reccommended.  - US showed fetus in transverse plane and no sign of abruption.  Assessment and Plan  - O71.9 Obstetric Trauma, Unspecified -  O99. 01 for Anemia complicating pregnancy - Monitor patients stability.  F/U with patient OB/GYN PRN or sooner if status change.  Ahmed Prima 12/09/2022, 1:39 PM   CNM attestation:  I have seen and examined this patient and agree with above documentation in the PA student's note.   Felicia [name] is a 32 y.o. [id number] at [redacted]w[redacted]d who presents to MAU after a fall. She reports the fall occurred around 1045 this morning while she was getting out of the shower. She had been taking a hot shower when she felt dizzy and tripped over the edge of the shower. She did not pass out or hit her head. She landed on her left side. She denies abdominal or pelvic pain but reports feeling sore around her tail bone. She denies vaginal bleeding, or leaking fluid. She reports active fetal movement. She has been evaluated by OBGYN and diagnosed with orthostatic hypotension and anemia. While feeling dizzy, she denies chest pain, shortness of breath or palpitations. She is not currently dizzy.  Patient receives [facility], next appointment is Friday.  PE: Patient Vitals for the past 24 hrs:  BP Temp Pulse Resp  12/09/22 1742 127/73 -- 89 --  12/09/22 1314 137/71 98.9 F (37.2 C) (!) 101 18   Gen: calm, comfortable, no acute distress Resp: normal effort, no distress Heart: regular rate Abd: soft, non-tender, gravid  FHR: 140 bpm, moderate variability, +15x15 accels, no decels Toco: quiet  ROS, labs, PMH reviewed  Orders Placed This Encounter  Procedures   [place] MFM OB LIMITED   Urinalysis, Routine w reflex microscopic -Urine, Clean Catch   CBC   Discharge patient   No orders of the defined types were placed in this encounter.  Results for orders placed or performed during the hospital encounter of 12/09/22 (from the past 24 hour(s))  CBC     Status: Abnormal   Collection Time: 12/09/22  2:07 PM  Result Value Ref Range   WBC 12.6 (H) 4.0 - 10.5 K/uL   RBC 3.81 (L) 3.87 - 5.11 MIL/uL   Hemoglobin 10.9 (L) 12.0 - 15.0 g/dL   HCT 71.9 (L) 94.1 - 29.0 %   MCV 85.0 80.0 - 100.0 fL   MCH 28.6 26.0 - 34.0 pg   MCHC 33.6 30.0 - 36.0 g/dL    RDW 47.5 33.9 - 17.9 %   Platelets 258 150 - 400 K/uL   nRBC 0.0 0.0 - 0.2 %  Urinalysis, Routine w reflex microscopic -Urine, Clean Catch     Status: Abnormal   Collection Time: 12/09/22  2:40 PM  Result Value Ref Range  Color, Urine YELLOW YELLOW   APPearance CLEAR CLEAR   Specific Gravity, Urine 1.009 1.005 - 1.030   pH 6.0 5.0 - 8.0   Glucose, UA NEGATIVE NEGATIVE mg/dL   Hgb urine dipstick NEGATIVE NEGATIVE   Bilirubin Urine NEGATIVE NEGATIVE   Ketones, ur NEGATIVE NEGATIVE mg/dL   Protein, ur NEGATIVE NEGATIVE mg/dL   Nitrite NEGATIVE NEGATIVE   Leukocytes,Ua SMALL (A) NEGATIVE   RBC / HPF 0-5 0 - 5 RBC/hpf   WBC, UA 0-5 0 - 5 WBC/hpf   Bacteria, UA RARE (A) NONE SEEN   Squamous Epithelial / HPF 0-5 0 - 5 /HPF       MDM [place] 4 hours CEFM  Prelim [place] reassuring. No evidence of abruption. AFI normal. NST reactive and reassuring. No contractions. Patient reports normal fetal movement. No bleeding or leaking fluid.   Assessment: 1. [redacted] weeks gestation of pregnancy   2. Fall, initial encounter     Plan: - Discharge home in stable condition - Return precautions given. Return to MAU as needed for new/worsening symptoms - Keep OB appointment as scheduled   Brand Males, CNM 12/09/2022 5:59 PM

## 2022-12-09 NOTE — Telephone Encounter (Signed)
Pt called stating that she fell this AM and feels fine but was concerned she is [redacted]w[redacted]d pregnant.  Per protocol she was instructed to [facility] to [facility].  Pt voices understanding

## 2022-12-09 NOTE — MAU Note (Signed)
.  Felicia [name] is a 32 y.o. at [redacted]w[redacted]d here in MAU reporting: around 10:45 she was getting into the shower felt a little dizzy  and caught her foot on the lip and fell on her left side. Denies any pain or cramping . Reports she had not felt baby move since the incident but also reports fetal movement has been less for a few weeks now.  LMP:  Onset of complaint: [id number] Pain score: 0 Vitals:   12/09/22 1314  BP: 137/71  Pulse: (!) 101  Resp: 18  Temp: 98.9 F (37.2 C)     FHT:145 Lab orders placed from triage:

## 2022-12-12 ENCOUNTER — Ambulatory Visit: Payer: Managed Care, Other (non HMO) | Admitting: Obstetrics & Gynecology

## 2022-12-12 ENCOUNTER — Inpatient Hospital Stay (HOSPITAL_COMMUNITY)
Admission: AD | Admit: 2022-12-12 | Discharge: 2022-12-12 | Disposition: A | Discharge status: Home / Self Care | Payer: Managed Care, Other (non HMO) | Attending: Obstetrics and Gynecology | Admitting: Obstetrics and Gynecology

## 2022-12-12 ENCOUNTER — Encounter (HOSPITAL_COMMUNITY): Payer: Self-pay | Admitting: Obstetrics and Gynecology

## 2022-12-12 ENCOUNTER — Encounter: Payer: Self-pay | Admitting: Obstetrics & Gynecology

## 2022-12-12 VITALS — BP 140/82 | HR 101 | Wt 301.0 lb

## 2022-12-12 DIAGNOSIS — Z3483 Encounter for supervision of other normal pregnancy, third trimester: Secondary | ICD-10-CM

## 2022-12-12 DIAGNOSIS — O99013 Anemia complicating pregnancy, third trimester: Secondary | ICD-10-CM | POA: Diagnosis not present

## 2022-12-12 DIAGNOSIS — R000 Tachycardia, unspecified: Secondary | ICD-10-CM | POA: Insufficient documentation

## 2022-12-12 DIAGNOSIS — Z3A34 34 weeks gestation of pregnancy: Secondary | ICD-10-CM | POA: Insufficient documentation

## 2022-12-12 DIAGNOSIS — O99513 Diseases of the respiratory system complicating pregnancy, third trimester: Secondary | ICD-10-CM | POA: Insufficient documentation

## 2022-12-12 DIAGNOSIS — G44209 Tension-type headache, unspecified, not intractable: Secondary | ICD-10-CM | POA: Insufficient documentation

## 2022-12-12 DIAGNOSIS — Z3403 Encounter for supervision of normal first pregnancy, third trimester: Secondary | ICD-10-CM

## 2022-12-12 DIAGNOSIS — R030 Elevated blood-pressure reading, without diagnosis of hypertension: Secondary | ICD-10-CM | POA: Diagnosis present

## 2022-12-12 DIAGNOSIS — O99353 Diseases of the nervous system complicating pregnancy, third trimester: Secondary | ICD-10-CM | POA: Diagnosis not present

## 2022-12-12 DIAGNOSIS — O99213 Obesity complicating pregnancy, third trimester: Secondary | ICD-10-CM | POA: Diagnosis not present

## 2022-12-12 DIAGNOSIS — O10913 Unspecified pre-existing hypertension complicating pregnancy, third trimester: Secondary | ICD-10-CM | POA: Diagnosis not present

## 2022-12-12 DIAGNOSIS — Z3689 Encounter for other specified antenatal screening: Secondary | ICD-10-CM

## 2022-12-12 DIAGNOSIS — O26893 Other specified pregnancy related conditions, third trimester: Secondary | ICD-10-CM | POA: Insufficient documentation

## 2022-12-12 LAB — CBC
HCT: 33.1 % — ABNORMAL LOW (ref 36.0–46.0)
Hemoglobin: 11 g/dL — ABNORMAL LOW (ref 12.0–15.0)
MCH: 28.7 pg (ref 26.0–34.0)
MCHC: 33.2 g/dL (ref 30.0–36.0)
MCV: 86.4 fL (ref 80.0–100.0)
Platelets: 245 10*3/uL (ref 150–400)
RBC: 3.83 MIL/uL — ABNORMAL LOW (ref 3.87–5.11)
RDW: 14.2 % (ref 11.5–15.5)
WBC: 12.7 10*3/uL — ABNORMAL HIGH (ref 4.0–10.5)
nRBC: 0 % (ref 0.0–0.2)

## 2022-12-12 LAB — COMPREHENSIVE METABOLIC PANEL
ALT: 9 U/L (ref 0–44)
AST: 11 U/L — ABNORMAL LOW (ref 15–41)
Albumin: 2.2 g/dL — ABNORMAL LOW (ref 3.5–5.0)
Alkaline Phosphatase: 159 U/L — ABNORMAL HIGH (ref 38–126)
Anion gap: 10 (ref 5–15)
BUN: <5 mg/dL — ABNORMAL LOW (ref 6–20)
CO2: 18 mmol/L — ABNORMAL LOW (ref 22–32)
Calcium: 8.4 mg/dL — ABNORMAL LOW (ref 8.9–10.3)
Chloride: 109 mmol/L (ref 98–111)
Creatinine, Ser: 0.58 mg/dL (ref 0.44–1.00)
GFR, Estimated: >60 mL/min (ref >60)
Glucose, Bld: 111 mg/dL — ABNORMAL HIGH (ref 70–99)
Potassium: 3.9 mmol/L (ref 3.5–5.1)
Sodium: 137 mmol/L (ref 135–145)
Total Bilirubin: 0.5 mg/dL (ref 0.3–1.2)
Total Protein: 6.4 g/dL — ABNORMAL LOW (ref 6.5–8.1)

## 2022-12-12 LAB — POCT URINALYSIS DIPSTICK
Bilirubin, UA: NEGATIVE
Blood, UA: NEGATIVE
Glucose, UA: NEGATIVE
Ketones, UA: NEGATIVE
Leukocytes, UA: NEGATIVE
Nitrite, UA: NEGATIVE
Protein, UA: POSITIVE — AB
Spec Grav, UA: 1.01 (ref 1.010–1.025)
Urobilinogen, UA: 0.2 E.U./dL
pH, UA: 5 (ref 5.0–8.0)

## 2022-12-12 LAB — PROTEIN / CREATININE RATIO, URINE
Creatinine, Urine: 70 mg/dL
Protein Creatinine Ratio: 0.21 mg/mg{Cre} — ABNORMAL HIGH (ref 0.00–0.15)
Total Protein, Urine: 15 mg/dL

## 2022-12-12 MED ORDER — ACETAMINOPHEN-CAFFEINE 500-65 MG PO TABS
2.0000 | ORAL_TABLET | Freq: Once | ORAL | Status: AC
  Administered 2022-12-12: 2 via ORAL
  Filled 2022-12-12: qty 2

## 2022-12-12 NOTE — Progress Notes (Signed)
Pt took BP at home and it was 172/100  Pt has had headache- Tylenol helped  Pt denies visual changes

## 2022-12-12 NOTE — MAU Note (Signed)
.  Felicia [name] is a 32 y.o. at [redacted]w[redacted]d here in MAU reporting: sent from office for elevated b/p 170/100. Then has been 1405-150's/90's. Was here on Monday for a fall. Has had a few episodes of being dizzy as well. C/O mild headache took tylenol and is down to 4. No visual changes. Good fetal movement felt.  LMP:  Onset of complaint: today Pain score: 4 Vitals:   12/12/22 1116  BP: 137/78  Pulse: 93  Temp: (!) 97.5 F (36.4 C)     FHT:147 Lab orders placed from triage:

## 2022-12-12 NOTE — MAU Provider Note (Signed)
History     CSN: [id number]  Arrival date and time: 12/12/22 1045   Event Date/Time   First Provider Initiated Contact with Patient 12/12/22 1154      Chief Complaint  Patient presents with   Hypertension   31 y.o. [id number] @34 .1 wks sent from office for elevated BP and HA. Reports onset of HA and heart racing episode yesterday. HA is frontal today. She took Tylenol around 6 am which helped but is wearing off now. Denies visual disturbances, RUQ pain, SOB, and CP. Reports good FM.      OB History     Gravida  5   Para  3   Term  3   Preterm      AB  1   Living  3      SAB  1   IAB      Ectopic      Multiple  0   Live Births  3           Past Medical History:  Diagnosis Date   Asthma    Hidradenitis suppurativa     Past Surgical History:  Procedure Laterality Date   CHOLECYSTECTOMY  2011   DILATION AND EVACUATION N/A 03/07/2015   Procedure: DILATATION AND EVACUATION;  Surgeon: Levi Aland, MD;  Location: [facility];  Service: Gynecology;  Laterality: N/A;    Family History  Problem Relation Age of Onset   Diabetes Mother    Hypertension Father     Social History   Tobacco Use   Smoking status: Never   Smokeless tobacco: Never  Vaping Use   Vaping Use: Never used  Substance Use Topics   Alcohol use: No   Drug use: No    Allergies:  Allergies  Allergen Reactions   Amoxicillin     "It does something to my liver enzymes and my skin turns yellow."    Medications Prior to Admission  Medication Sig Dispense Refill Last Dose   acetaminophen (TYLENOL) 500 MG tablet Take 500 mg by mouth daily as needed for mild pain or moderate pain.   12/12/2022   aspirin EC 81 MG tablet Take 1 tablet (81 mg total) by mouth daily. Swallow whole. 30 tablet 12 12/11/2022   famotidine (PEPCID) 20 MG tablet Take 1 tablet (20 mg total) by mouth 2 (two) times daily. 60 tablet 3 12/11/2022   ferrous sulfate 325 (65 FE) MG EC tablet Take 1 tablet (325 mg total) by  mouth every other day. 90 tablet 1 12/11/2022   fluticasone (FLOVENT HFA) 44 MCG/ACT inhaler Inhale 2 puffs into the lungs 2 (two) times daily. 1 each 2 12/12/2022   Prenatal Vit-Fe Fumarate-FA (PRENATAL MULTIVITAMIN) TABS tablet Take 1 tablet by mouth at bedtime.   12/11/2022    Review of Systems  Eyes:  Negative for visual disturbance.  Respiratory:  Negative for shortness of breath.   Cardiovascular:  Negative for chest pain.  Gastrointestinal:  Negative for abdominal pain.  Neurological:  Positive for headaches.   Physical Exam   Blood pressure 122/89, pulse 93, temperature (!) 97.5 F (36.4 C), height 5\' 7"  (1.702 m), last menstrual period 04/17/2022, SpO2 98 %, unknown if currently breastfeeding. Patient Vitals for the past 24 hrs:  BP Temp Pulse SpO2 Height  12/12/22 1330 122/89 -- 93 98 % --  12/12/22 1317 120/70 -- 88 -- --  12/12/22 1315 -- -- -- 98 % --  12/12/22 1246 129/87 -- 97 99 % --  12/12/22  1230 130/75 -- 90 98 % --  12/12/22 1215 133/79 -- 90 99 % --  12/12/22 1200 138/85 -- 87 98 % --  12/12/22 1145 128/77 -- 91 97 % --  12/12/22 1116 137/78 (!) 97.5 F (36.4 C) 93 -- 5\' 7"  (1.702 m)    Physical Exam Vitals and nursing note reviewed.  Constitutional:      General: She is not in acute distress.    Appearance: Normal appearance.  HENT:     Head: Normocephalic and atraumatic.  Pulmonary:     Effort: Pulmonary effort is normal. No respiratory distress.  Musculoskeletal:        General: Normal range of motion.     Cervical back: Normal range of motion.  Neurological:     General: No focal deficit present.     Mental Status: She is alert and oriented to person, place, and time.  Psychiatric:        Mood and Affect: Mood normal.        Behavior: Behavior normal.   EFM: 135 bpm, mod variability, + accels, no decels Toco: none  Results for orders placed or performed during the hospital encounter of 12/12/22 (from the past 24 hour(s))  CBC     Status:  Abnormal   Collection Time: 12/12/22 11:10 AM  Result Value Ref Range   WBC 12.7 (H) 4.0 - 10.5 K/uL   RBC 3.83 (L) 3.87 - 5.11 MIL/uL   Hemoglobin 11.0 (L) 12.0 - 15.0 g/dL   HCT 63.8 (L) 46.6 - 59.9 %   MCV 86.4 80.0 - 100.0 fL   MCH 28.7 26.0 - 34.0 pg   MCHC 33.2 30.0 - 36.0 g/dL   RDW 35.7 01.7 - 79.3 %   Platelets 245 150 - 400 K/uL   nRBC 0.0 0.0 - 0.2 %  Comprehensive metabolic panel     Status: Abnormal   Collection Time: 12/12/22 11:10 AM  Result Value Ref Range   Sodium 137 135 - 145 mmol/L   Potassium 3.9 3.5 - 5.1 mmol/L   Chloride 109 98 - 111 mmol/L   CO2 18 (L) 22 - 32 mmol/L   Glucose, Bld 111 (H) 70 - 99 mg/dL   BUN <5 (L) 6 - 20 mg/dL   Creatinine, Ser 9.03 0.44 - 1.00 mg/dL   Calcium 8.4 (L) 8.9 - 10.3 mg/dL   Total Protein 6.4 (L) 6.5 - 8.1 g/dL   Albumin 2.2 (L) 3.5 - 5.0 g/dL   AST 11 (L) 15 - 41 U/L   ALT 9 0 - 44 U/L   Alkaline Phosphatase 159 (H) 38 - 126 U/L   Total Bilirubin 0.5 0.3 - 1.2 mg/dL   GFR, Estimated >00 >92 mL/min   Anion gap 10 5 - 15  Protein / creatinine ratio, urine     Status: Abnormal   Collection Time: 12/12/22 11:45 AM  Result Value Ref Range   Creatinine, Urine 70 mg/dL   Total Protein, Urine 15 mg/dL   Protein Creatinine Ratio 0.21 (H) 0.00 - 0.15 mg/mg[Cre]   MAU Course  Procedures Excedrin  MDM Prenatal records reviewed: obesity and anemia. Labs ordered and reviewed.  HA improved. No signs of PEC. Stable for discharge home.  Assessment and Plan   1. [redacted] weeks gestation of pregnancy   2. NST (non-stress test) reactive   3. Acute non intractable tension-type headache    Discharge home Follow [facility] next week Follow up at MFM tomorrow PEC precautions  Allergies as of 12/12/2022       Reactions   Amoxicillin    "It does something to my liver enzymes and my skin turns yellow."        Medication List     TAKE these medications    acetaminophen 500 MG tablet Commonly known as: TYLENOL Take 500 mg by  mouth daily as needed for mild pain or moderate pain.   aspirin EC 81 MG tablet Take 1 tablet (81 mg total) by mouth daily. Swallow whole.   famotidine 20 MG tablet Commonly known as: PEPCID Take 1 tablet (20 mg total) by mouth 2 (two) times daily.   ferrous sulfate 325 (65 FE) MG EC tablet Take 1 tablet (325 mg total) by mouth every other day.   fluticasone 44 MCG/ACT inhaler Commonly known as: FLOVENT HFA Inhale 2 puffs into the lungs 2 (two) times daily.   prenatal multivitamin Tabs tablet Take 1 tablet by mouth at bedtime.        Donette LarryMelanie Kiyona Mcnall, CNM 12/12/2022, 1:56 PM

## 2022-12-12 NOTE — Progress Notes (Addendum)
LOW-RISK PREGNANCY VISIT Patient name: Felicia [name] MRN [id number]  Date of birth: 10-May-1991 Chief Complaint:   Routine Prenatal Visit  History of Present Illness:   JAIE ANDA is a 33 y.o. [id number] female at [redacted]w[redacted]d with an Estimated Date of Delivery: 01/22/23 being seen today for ongoing management of a low-risk pregnancy.   -BMI 45 Followed by MFM- serial growth and BPP weekly  Just not feeling great- notes dizziness and tachycardia.  Also notes headache did take tylenol- around 6:30am which did help.  Now down to about a 4/10.  No blurry vision. No RUQ pain.  Just overal doesn't feel right.  Swelling has been about the same     11/04/2022   10:18 AM 07/09/2022   10:06 AM  Depression screen PHQ 2/9  Decreased Interest 0 0  Down, Depressed, Hopeless 0 0  PHQ - 2 Score 0 0  Altered sleeping 0 0  Tired, decreased energy 1 1  Change in appetite 0 0  Feeling bad or failure about yourself  0 0  Trouble concentrating 0 0  Moving slowly or fidgety/restless 0 0  Suicidal thoughts 0 0  PHQ-9 Score 1 1  Difficult doing work/chores Not difficult at all    . Contractions: Not present. Vag. Bleeding: None.  Movement: Present.  Denies leaking of fluid. Review of Systems:   Pertinent items are noted in HPI Denies abnormal vaginal discharge w/ itching/odor/irritation, headaches, visual changes, shortness of breath, chest pain, abdominal pain, severe nausea/vomiting, or problems with urination or bowel movements unless otherwise stated above. Pertinent History Reviewed:  Reviewed past medical,surgical, social, obstetrical and family history.  Reviewed problem list, medications and allergies.  Physical Assessment:   Vitals:   12/12/22 0929 12/12/22 0951  BP: (!) 143/92 (!) 140/82  Pulse: 98 (!) 101  Weight: (!) 301 lb (136.5 kg)   Body mass index is 47.14 kg/m.        Physical Examination:   General appearance: Well appearing, and in no distress  Mental status: Alert, oriented to  person, place, and time  Skin: Warm & dry  Respiratory: Normal respiratory effort, no distress  Abdomen: Soft, gravid, nontender  Pelvic: Cervical exam deferred         Extremities: Edema: Trace  Psych:  mood and affect appropriate  Fetal Status: Fetal Heart Rate (bpm): 140   Movement: Present    Chaperone: n/a    Results for orders placed or performed in visit on 12/12/22 (from the past 24 hour(s))  POCT Urinalysis Dipstick   Collection Time: 12/12/22  9:46 AM  Result Value Ref Range   Color, UA     Clarity, UA     Glucose, UA Negative Negative   Bilirubin, UA neg    Ketones, UA neg    Spec Grav, UA 1.010 1.010 - 1.025   Blood, UA neg    pH, UA 5.0 5.0 - 8.0   Protein, UA Positive (A) Negative   Urobilinogen, UA 0.2 0.2 or 1.0 E.U./dL   Nitrite, UA neg    Leukocytes, UA Negative Negative   Appearance     Odor       Assessment & Plan:  1) Low-risk pregnancy [id number] at [redacted]w[redacted]d with an Estimated Date of Delivery: 01/22/23   -GestHTN vs preeclampsia Pt sent to MAU for further evaluation/lab work Hopeful of gestHTN- discussed plan for delivery around 37wks Plan for antepartum testing twice weekly   Meds: No orders of the defined types were placed in  this encounter.  Labs/procedures today: sent to MAU  Plan: As outlined above Next visit: prefers in person  Reviewed: Preterm labor symptoms and general obstetric precautions including but not limited to vaginal bleeding, contractions, leaking of fluid and fetal movement were reviewed in detail with the patient.  All questions were answered.  Patient has home bp cuff. Check bp weekly, let us know if >140/90.   Follow-up: BPP/NST weekly and weekly visits  Orders Placed This Encounter  Procedures   POCT Urinalysis Dipstick    Myna Hidalgo, DO Attending Obstetrician & Gynecologist, [facility] for [facility], [facility]

## 2022-12-13 ENCOUNTER — Other Ambulatory Visit: Payer: Self-pay | Admitting: *Deleted

## 2022-12-13 ENCOUNTER — Ambulatory Visit: Payer: Managed Care, Other (non HMO) | Attending: Obstetrics

## 2022-12-13 ENCOUNTER — Ambulatory Visit: Payer: Managed Care, Other (non HMO) | Admitting: *Deleted

## 2022-12-13 VITALS — BP 124/67 | HR 85

## 2022-12-13 DIAGNOSIS — D649 Anemia, unspecified: Secondary | ICD-10-CM | POA: Diagnosis not present

## 2022-12-13 DIAGNOSIS — O99013 Anemia complicating pregnancy, third trimester: Secondary | ICD-10-CM

## 2022-12-13 DIAGNOSIS — Z3A34 34 weeks gestation of pregnancy: Secondary | ICD-10-CM

## 2022-12-13 DIAGNOSIS — Z8279 Family history of other congenital malformations, deformations and chromosomal abnormalities: Secondary | ICD-10-CM

## 2022-12-13 DIAGNOSIS — R638 Other symptoms and signs concerning food and fluid intake: Secondary | ICD-10-CM

## 2022-12-13 DIAGNOSIS — O99213 Obesity complicating pregnancy, third trimester: Secondary | ICD-10-CM

## 2022-12-13 DIAGNOSIS — O09293 Supervision of pregnancy with other poor reproductive or obstetric history, third trimester: Secondary | ICD-10-CM

## 2022-12-13 DIAGNOSIS — O3663X0 Maternal care for excessive fetal growth, third trimester, not applicable or unspecified: Secondary | ICD-10-CM

## 2022-12-13 DIAGNOSIS — O09299 Supervision of pregnancy with other poor reproductive or obstetric history, unspecified trimester: Secondary | ICD-10-CM

## 2022-12-13 DIAGNOSIS — E669 Obesity, unspecified: Secondary | ICD-10-CM

## 2022-12-13 DIAGNOSIS — Z3689 Encounter for other specified antenatal screening: Secondary | ICD-10-CM | POA: Diagnosis present

## 2022-12-16 ENCOUNTER — Other Ambulatory Visit: Payer: Managed Care, Other (non HMO)

## 2022-12-16 ENCOUNTER — Ambulatory Visit: Payer: Managed Care, Other (non HMO) | Admitting: Obstetrics & Gynecology

## 2022-12-16 VITALS — BP 130/81 | HR 86 | Wt 302.0 lb

## 2022-12-16 DIAGNOSIS — O09299 Supervision of pregnancy with other poor reproductive or obstetric history, unspecified trimester: Secondary | ICD-10-CM

## 2022-12-16 NOTE — Progress Notes (Signed)
   PRENATAL VISIT NOTE  Subjective:  Felicia [name] is a 32 y.o. [id number] at [redacted]w[redacted]d being seen today for ongoing prenatal care.  She is currently monitored for the following issues for this high-risk pregnancy and has History of macrosomia in infant in prior pregnancy, currently pregnant; Obesity affecting pregnancy, antepartum; Supervision of high risk pregnancy, antepartum; History of fetal anomaly in prior pregnancy, currently pregnant; Hidradenitis suppurativa; and Anemia on their problem list.  Patient reports no complaints.  Contractions: Not present. Vag. Bleeding: None.  Movement: Present. Denies leaking of fluid.   The following portions of the patient's history were reviewed and updated as appropriate: allergies, current medications, past family history, past medical history, past social history, past surgical history and problem list.   Objective:   Vitals:   12/16/22 1039  BP: 130/81  Pulse: 86  Weight: (!) 137 kg    Fetal Status:     Movement: Present     General:  Alert, oriented and cooperative. Patient is in no acute distress.  Skin: Skin is warm and dry. No rash noted.   Cardiovascular: Normal heart rate noted  Respiratory: Normal respiratory effort, no problems with respiration noted  Abdomen: Soft, gravid, appropriate for gestational age.  Pain/Pressure: Present     Pelvic: Cervical exam deferred        Extremities: Normal range of motion.  Edema: None  Mental Status: Normal mood and affect. Normal behavior. Normal judgment and thought content.   Assessment and Plan:  Pregnancy: [id number] at [redacted]w[redacted]d 1. History of macrosomia in infant in prior pregnancy, currently pregnant EFW 96% at 34 weeks Continue serial [place] and Weekly BPP with MFM Vertex by [place] today  2.  BP normal today--headaches appear to be tension. BP at rest yesterday was 154/81 and then went down.  Pt will take BPs twice a day when at rest and notify us if >140/90.   Preterm labor symptoms and general  obstetric precautions including but not limited to vaginal bleeding, contractions, leaking of fluid and fetal movement were reviewed in detail with the patient. Please refer to After Visit Summary for other counseling recommendations.   No follow-ups on file.  Future Appointments  Date Time Provider Department Center  12/20/2022  9:30 AM [facility] NURSE [facility]  12/20/2022  9:45 AM [facility] NST [facility]  12/27/2022  9:15 AM [facility] NURSE [facility]  12/27/2022  9:30 AM [facility] US3 [facility]  12/30/2022  9:50 AM Lennart Pall, MD [facility]  01/03/2023 10:30 AM [facility] NURSE [facility]  01/03/2023 10:45 AM [facility] NST [facility]  01/06/2023  9:50 AM Lesly Dukes, MD [facility]  01/10/2023  9:30 AM [facility] NURSE [facility]  01/10/2023  9:45 AM [facility] US4 [facility]  01/13/2023  9:30 AM Lesly Dukes, MD [facility]  01/17/2023 10:30 AM [facility] NURSE [facility]  01/17/2023 10:45 AM [facility] NST [facility]  01/20/2023  9:50 AM Adajah Cocking, Fredrich Romans, MD [facility]    Elsie Lincoln, MD

## 2022-12-20 ENCOUNTER — Ambulatory Visit: Payer: Managed Care, Other (non HMO)

## 2022-12-20 ENCOUNTER — Ambulatory Visit (HOSPITAL_BASED_OUTPATIENT_CLINIC_OR_DEPARTMENT_OTHER): Payer: Managed Care, Other (non HMO) | Admitting: *Deleted

## 2022-12-20 ENCOUNTER — Ambulatory Visit: Payer: Managed Care, Other (non HMO) | Attending: Maternal & Fetal Medicine | Admitting: *Deleted

## 2022-12-20 VITALS — BP 123/60 | HR 78

## 2022-12-20 DIAGNOSIS — Z3A35 35 weeks gestation of pregnancy: Secondary | ICD-10-CM | POA: Insufficient documentation

## 2022-12-20 DIAGNOSIS — O26893 Other specified pregnancy related conditions, third trimester: Secondary | ICD-10-CM | POA: Insufficient documentation

## 2022-12-20 DIAGNOSIS — O99213 Obesity complicating pregnancy, third trimester: Secondary | ICD-10-CM

## 2022-12-20 DIAGNOSIS — O3660X0 Maternal care for excessive fetal growth, unspecified trimester, not applicable or unspecified: Secondary | ICD-10-CM

## 2022-12-20 NOTE — Procedures (Signed)
Felicia [name] [birth year] [redacted]w[redacted]d  Fetus A Non-Stress Test Interpretation for 12/20/22  Indication:  morbidly obese  Fetal Heart Rate A Mode: External Baseline Rate (A): 145 bpm Variability: Moderate Accelerations: 15 x 15 Decelerations: None Multiple birth?: No  Uterine Activity Mode: Toco Contraction Frequency (min): none Resting Tone Palpated: Relaxed  Interpretation (Fetal Testing) Nonstress Test Interpretation: Reactive Overall Impression: Reassuring for gestational age Comments: tracing reviewed byDr. Judeth Cornfield

## 2022-12-24 ENCOUNTER — Encounter (HOSPITAL_COMMUNITY): Payer: Self-pay | Admitting: Obstetrics & Gynecology

## 2022-12-24 ENCOUNTER — Encounter: Payer: Self-pay | Admitting: Obstetrics & Gynecology

## 2022-12-24 ENCOUNTER — Inpatient Hospital Stay (HOSPITAL_COMMUNITY)
Admission: AD | Admit: 2022-12-24 | Discharge: 2022-12-24 | Disposition: A | Discharge status: Home / Self Care | Payer: Managed Care, Other (non HMO) | Attending: Obstetrics & Gynecology | Admitting: Obstetrics & Gynecology

## 2022-12-24 DIAGNOSIS — O10913 Unspecified pre-existing hypertension complicating pregnancy, third trimester: Secondary | ICD-10-CM | POA: Diagnosis not present

## 2022-12-24 DIAGNOSIS — O26893 Other specified pregnancy related conditions, third trimester: Secondary | ICD-10-CM | POA: Diagnosis not present

## 2022-12-24 DIAGNOSIS — G44209 Tension-type headache, unspecified, not intractable: Secondary | ICD-10-CM | POA: Insufficient documentation

## 2022-12-24 DIAGNOSIS — O99213 Obesity complicating pregnancy, third trimester: Secondary | ICD-10-CM | POA: Insufficient documentation

## 2022-12-24 DIAGNOSIS — Z3A35 35 weeks gestation of pregnancy: Secondary | ICD-10-CM | POA: Insufficient documentation

## 2022-12-24 DIAGNOSIS — O09293 Supervision of pregnancy with other poor reproductive or obstetric history, third trimester: Secondary | ICD-10-CM | POA: Diagnosis not present

## 2022-12-24 DIAGNOSIS — O99353 Diseases of the nervous system complicating pregnancy, third trimester: Secondary | ICD-10-CM | POA: Insufficient documentation

## 2022-12-24 DIAGNOSIS — O99513 Diseases of the respiratory system complicating pregnancy, third trimester: Secondary | ICD-10-CM | POA: Diagnosis not present

## 2022-12-24 DIAGNOSIS — O99013 Anemia complicating pregnancy, third trimester: Secondary | ICD-10-CM | POA: Insufficient documentation

## 2022-12-24 DIAGNOSIS — R519 Headache, unspecified: Secondary | ICD-10-CM | POA: Diagnosis not present

## 2022-12-24 LAB — PROTEIN / CREATININE RATIO, URINE
Creatinine, Urine: 30 mg/dL
Protein Creatinine Ratio: 0.2 mg/mg{Cre} — ABNORMAL HIGH (ref 0.00–0.15)
Total Protein, Urine: 6 mg/dL

## 2022-12-24 LAB — COMPREHENSIVE METABOLIC PANEL
ALT: 8 U/L (ref 0–44)
AST: 11 U/L — ABNORMAL LOW (ref 15–41)
Albumin: 2 g/dL — ABNORMAL LOW (ref 3.5–5.0)
Alkaline Phosphatase: 180 U/L — ABNORMAL HIGH (ref 38–126)
Anion gap: 8 (ref 5–15)
BUN: <5 mg/dL — ABNORMAL LOW (ref 6–20)
CO2: 21 mmol/L — ABNORMAL LOW (ref 22–32)
Calcium: 8.4 mg/dL — ABNORMAL LOW (ref 8.9–10.3)
Chloride: 107 mmol/L (ref 98–111)
Creatinine, Ser: 0.57 mg/dL (ref 0.44–1.00)
GFR, Estimated: >60 mL/min (ref >60)
Glucose, Bld: 117 mg/dL — ABNORMAL HIGH (ref 70–99)
Potassium: 3.8 mmol/L (ref 3.5–5.1)
Sodium: 136 mmol/L (ref 135–145)
Total Bilirubin: 0.5 mg/dL (ref 0.3–1.2)
Total Protein: 6.1 g/dL — ABNORMAL LOW (ref 6.5–8.1)

## 2022-12-24 LAB — CBC
HCT: 33.3 % — ABNORMAL LOW (ref 36.0–46.0)
Hemoglobin: 10.5 g/dL — ABNORMAL LOW (ref 12.0–15.0)
MCH: 27.8 pg (ref 26.0–34.0)
MCHC: 31.5 g/dL (ref 30.0–36.0)
MCV: 88.1 fL (ref 80.0–100.0)
Platelets: 234 10*3/uL (ref 150–400)
RBC: 3.78 MIL/uL — ABNORMAL LOW (ref 3.87–5.11)
RDW: 14.1 % (ref 11.5–15.5)
WBC: 10.6 10*3/uL — ABNORMAL HIGH (ref 4.0–10.5)
nRBC: 0 % (ref 0.0–0.2)

## 2022-12-24 MED ORDER — CYCLOBENZAPRINE HCL 10 MG PO TABS: 10.0000 mg | ORAL_TABLET | Freq: Two times a day (BID) | ORAL | 0 refills | Status: DC | PRN

## 2022-12-24 MED ORDER — CYCLOBENZAPRINE HCL 5 MG PO TABS
5.0000 mg | ORAL_TABLET | Freq: Once | ORAL | Status: AC
  Administered 2022-12-24: 5 mg via ORAL
  Filled 2022-12-24: qty 1

## 2022-12-24 NOTE — MAU Provider Note (Signed)
History     CSN: [id number] Arrival date and time: 12/24/22 1254   Event Date/Time   First Provider Initiated Contact with Patient 12/24/22 1420      Chief Complaint  Patient presents with   Hypertension   Headache   Edema   Hypertension Associated symptoms include headaches. Pertinent negatives include no chest pain or shortness of breath.  Headache  Pertinent negatives include no abdominal pain, back pain, coughing, dizziness, eye pain, fever, nausea, sore throat or vomiting. Her past medical history is significant for hypertension.   Patient is 32 y.o. [id number] [redacted]w[redacted]d here with complaints of Headache-- started last night and was 8/10 in pain. She reports she took Tylenol  at 500AM and at 11:30. The tylenol reduced the pain to 3/10 intensity. Denies swelling, RUQ pain. Has had elevated BP at home.  +FM, denies LOF, VB, contractions, vaginal discharge.   OB History     Gravida  5   Para  3   Term  3   Preterm      AB  1   Living  3      SAB  1   IAB      Ectopic      Multiple  0   Live Births  3           Past Medical History:  Diagnosis Date   Asthma    Hidradenitis suppurativa     Past Surgical History:  Procedure Laterality Date   CHOLECYSTECTOMY  2011   DILATION AND EVACUATION N/A 03/07/2015   Procedure: DILATATION AND EVACUATION;  Surgeon: Levi Aland, MD;  Location: [facility];  Service: Gynecology;  Laterality: N/A;    Family History  Problem Relation Age of Onset   Diabetes Mother    Hypertension Father     Social History   Tobacco Use   Smoking status: Never   Smokeless tobacco: Never  Vaping Use   Vaping Use: Never used  Substance Use Topics   Alcohol use: No   Drug use: No    Allergies:  Allergies  Allergen Reactions   Amoxicillin     "It does something to my liver enzymes and my skin turns yellow."    No medications prior to admission.    Review of Systems  Constitutional:  Negative for chills and fever.   HENT:  Negative for congestion and sore throat.   Eyes:  Negative for pain and visual disturbance.  Respiratory:  Negative for cough, chest tightness and shortness of breath.   Cardiovascular:  Negative for chest pain.  Gastrointestinal:  Negative for abdominal pain, diarrhea, nausea and vomiting.  Endocrine: Negative for cold intolerance and heat intolerance.  Genitourinary:  Negative for dysuria and flank pain.  Musculoskeletal:  Negative for back pain.  Skin:  Negative for rash.  Allergic/Immunologic: Negative for food allergies.  Neurological:  Positive for headaches. Negative for dizziness and light-headedness.  Psychiatric/Behavioral:  Negative for agitation.    Physical Exam   Blood pressure (!) 109/54, pulse 98, temperature 98 F (36.7 C), temperature source Oral, resp. rate 14, weight (!) 141.3 kg, last menstrual period 04/17/2022, SpO2 99 %, unknown if currently breastfeeding.  Physical Exam Vitals and nursing note reviewed.  Constitutional:      General: She is not in acute distress.    Appearance: She is well-developed.     Comments: Pregnant female  HENT:     Head: Normocephalic and atraumatic.  Eyes:     General: No scleral  icterus.    Conjunctiva/sclera: Conjunctivae normal.  Cardiovascular:     Rate and Rhythm: Normal rate.  Pulmonary:     Effort: Pulmonary effort is normal.  Chest:     Chest wall: No tenderness.  Abdominal:     Palpations: Abdomen is soft.     Tenderness: There is no abdominal tenderness. There is no guarding or rebound.     Comments: Gravid  Genitourinary:    Vagina: Normal.  Musculoskeletal:        General: Normal range of motion.     Cervical back: Normal range of motion and neck supple.  Skin:    General: Skin is warm and dry.     Findings: No rash.  Neurological:     General: No focal deficit present.     Mental Status: She is alert and oriented to person, place, and time.     GCS: GCS eye subscore is 4. GCS verbal subscore is  5. GCS motor subscore is 6.     Cranial Nerves: Cranial nerves 2-12 are intact.     Motor: Motor function is intact.     Coordination: Coordination is intact.     MAU Course  Procedures NST 140/moderate/+accels, no decels Toco quiet  MDM: moderate  This patient presents to the ED for concern of   Chief Complaint  Patient presents with   Hypertension   Headache   Edema     These complains involves an extensive number of treatment options, and is a complaint that carries with it a high risk of complications and morbidity.  The differential diagnosis for  1. Headache in pregnancy INCLUDES preeclamsia, PRES, migraines, tension HA.  Most likely tension given normal BPs here at MAU. Unlikely PRES  Co morbidities that complicate the patient evaluation: Patient Active Problem List   Diagnosis Date Noted   Anemia 11/05/2022   Hidradenitis suppurativa 08/05/2022   History of macrosomia in infant in prior pregnancy, currently pregnant 07/09/2022   Obesity affecting pregnancy, antepartum 07/09/2022   Supervision of high risk pregnancy, antepartum 07/09/2022   History of fetal anomaly in prior pregnancy, currently pregnant 07/09/2022   External records from outside source obtained and reviewed including Prenatal care records  Lab Tests: CMP, CBC, and Urine Protein Creatinine Ratio  I ordered, and personally interpreted labs.  The pertinent results include:   Results for orders placed or performed during the hospital encounter of 12/24/22 (from the past 24 hour(s))  CBC     Status: Abnormal   Collection Time: 12/24/22  1:14 PM  Result Value Ref Range   WBC 10.6 (H) 4.0 - 10.5 K/uL   RBC 3.78 (L) 3.87 - 5.11 MIL/uL   Hemoglobin 10.5 (L) 12.0 - 15.0 g/dL   HCT 40.9 (L) 81.1 - 91.4 %   MCV 88.1 80.0 - 100.0 fL   MCH 27.8 26.0 - 34.0 pg   MCHC 31.5 30.0 - 36.0 g/dL   RDW 78.2 95.6 - 21.3 %   Platelets 234 150 - 400 K/uL   nRBC 0.0 0.0 - 0.2 %  Comprehensive metabolic panel      Status: Abnormal   Collection Time: 12/24/22  1:14 PM  Result Value Ref Range   Sodium 136 135 - 145 mmol/L   Potassium 3.8 3.5 - 5.1 mmol/L   Chloride 107 98 - 111 mmol/L   CO2 21 (L) 22 - 32 mmol/L   Glucose, Bld 117 (H) 70 - 99 mg/dL   BUN <5 (L) 6 -  20 mg/dL   Creatinine, Ser 1.61 0.44 - 1.00 mg/dL   Calcium 8.4 (L) 8.9 - 10.3 mg/dL   Total Protein 6.1 (L) 6.5 - 8.1 g/dL   Albumin 2.0 (L) 3.5 - 5.0 g/dL   AST 11 (L) 15 - 41 U/L   ALT 8 0 - 44 U/L   Alkaline Phosphatase 180 (H) 38 - 126 U/L   Total Bilirubin 0.5 0.3 - 1.2 mg/dL   GFR, Estimated >09 >60 mL/min   Anion gap 8 5 - 15  Protein / creatinine ratio, urine     Status: Abnormal   Collection Time: 12/24/22  1:43 PM  Result Value Ref Range   Creatinine, Urine 30 mg/dL   Total Protein, Urine 6 mg/dL   Protein Creatinine Ratio 0.20 (H) 0.00 - 0.15 mg/mg[Cre]   Medicines ordered and prescription drug management:  Medications: Flexeril   Reevaluation of the patient after these medicines showed that the patient improved I have reviewed the patients home medicines and have made adjustments as needed  Test Considered: Head CT, not warranted to due to normal neurological exam and normal labs with improving/improved HA with flexeril.  MAU Course: 5:02 PM HA improved with flexeril. Now rates as 1/10 and has not eaten all day.    After the interventions noted above, I reevaluated the patient and found that they have :improved  Dispostion: discharged   Assessment and Plan   1. Pregnancy headache in third trimester   2. [redacted] weeks gestation of pregnancy   3. Tension headache    - Home with flexeril - Recommend Tylenol PRN - Return for continued HA and would recommend CT at that time - Follow up at outpatient office.  Future Appointments  Date Time Provider Department Center  12/27/2022  9:15 AM [facility] NURSE [facility]  12/27/2022  9:30 AM [facility] US3 [facility]  12/30/2022  9:50 AM Lennart Pall, MD  [facility]  01/03/2023 10:30 AM [facility] NURSE [facility]  01/03/2023 10:45 AM [facility] NST [facility]  01/06/2023  9:50 AM Lesly Dukes, MD [facility]  01/10/2023  9:15 AM [facility] NURSE [facility]  01/10/2023  9:30 AM [facility] US2 [facility]  01/13/2023  9:30 AM Lesly Dukes, MD [facility]  01/17/2023 10:30 AM [facility] NURSE [facility]  01/17/2023 10:45 AM [facility] NST [facility]  01/20/2023  9:50 AM Lesly Dukes, MD [facility]    Allergies as of 12/24/2022       Reactions   Amoxicillin    "It does something to my liver enzymes and my skin turns yellow."        Medication List     TAKE these medications    acetaminophen 500 MG tablet Commonly known as: TYLENOL Take 500 mg by mouth daily as needed for mild pain or moderate pain.   aspirin EC 81 MG tablet Take 1 tablet (81 mg total) by mouth daily. Swallow whole.   cyclobenzaprine 10 MG tablet Commonly known as: FLEXERIL Take 1 tablet (10 mg total) by mouth 2 (two) times daily as needed for muscle spasms.   famotidine 20 MG tablet Commonly known as: PEPCID Take 1 tablet (20 mg total) by mouth 2 (two) times daily.   ferrous sulfate 325 (65 FE) MG EC tablet Take 1 tablet (325 mg total) by mouth every other day.   fluticasone 44 MCG/ACT inhaler Commonly known as: FLOVENT HFA Inhale 2 puffs into the lungs 2 (two) times daily.   prenatal  multivitamin Tabs tablet Take 1 tablet by mouth at bedtime.        Isa Rankin [facility] 12/24/2022, 5:02 PM

## 2022-12-24 NOTE — MAU Note (Addendum)
.  Felicia [name] is a 32 y.o. at [redacted]w[redacted]d here in MAU reporting: sent from the office for elevated BP, she also has a headache and is seeing "black lines". Also has swelling in BLE. Last took tylenol at 1130 for her headache. Denies VB or LOF. +FM.   Pain score: 4 Vitals:   12/24/22 1323  BP: 131/74  Pulse: 91  Resp: 14  Temp: 98 F (36.7 C)  SpO2: 99%     FHT:142

## 2022-12-27 ENCOUNTER — Ambulatory Visit: Payer: Managed Care, Other (non HMO) | Attending: Obstetrics

## 2022-12-27 ENCOUNTER — Ambulatory Visit: Payer: Managed Care, Other (non HMO) | Admitting: *Deleted

## 2022-12-27 VITALS — BP 112/66 | HR 78

## 2022-12-27 DIAGNOSIS — O99013 Anemia complicating pregnancy, third trimester: Secondary | ICD-10-CM | POA: Diagnosis not present

## 2022-12-27 DIAGNOSIS — Z3A36 36 weeks gestation of pregnancy: Secondary | ICD-10-CM

## 2022-12-27 DIAGNOSIS — O99213 Obesity complicating pregnancy, third trimester: Secondary | ICD-10-CM

## 2022-12-27 DIAGNOSIS — O09293 Supervision of pregnancy with other poor reproductive or obstetric history, third trimester: Secondary | ICD-10-CM | POA: Diagnosis not present

## 2022-12-27 DIAGNOSIS — D649 Anemia, unspecified: Secondary | ICD-10-CM | POA: Diagnosis not present

## 2022-12-27 DIAGNOSIS — O3663X0 Maternal care for excessive fetal growth, third trimester, not applicable or unspecified: Secondary | ICD-10-CM

## 2022-12-27 DIAGNOSIS — E669 Obesity, unspecified: Secondary | ICD-10-CM

## 2022-12-27 DIAGNOSIS — O09299 Supervision of pregnancy with other poor reproductive or obstetric history, unspecified trimester: Secondary | ICD-10-CM

## 2022-12-27 DIAGNOSIS — Z8279 Family history of other congenital malformations, deformations and chromosomal abnormalities: Secondary | ICD-10-CM

## 2022-12-30 ENCOUNTER — Other Ambulatory Visit (HOSPITAL_COMMUNITY)
Admission: RE | Admit: 2022-12-30 | Discharge: 2022-12-30 | Disposition: A | Discharge status: Home / Self Care | Payer: Managed Care, Other (non HMO) | Source: Ambulatory Visit | Attending: Obstetrics and Gynecology | Admitting: Obstetrics and Gynecology

## 2022-12-30 ENCOUNTER — Ambulatory Visit: Payer: Managed Care, Other (non HMO) | Admitting: Obstetrics and Gynecology

## 2022-12-30 ENCOUNTER — Encounter: Payer: Self-pay | Admitting: Obstetrics and Gynecology

## 2022-12-30 VITALS — BP 120/81 | HR 81 | Wt 312.0 lb

## 2022-12-30 DIAGNOSIS — O0993 Supervision of high risk pregnancy, unspecified, third trimester: Secondary | ICD-10-CM

## 2022-12-30 DIAGNOSIS — Z3A36 36 weeks gestation of pregnancy: Secondary | ICD-10-CM

## 2022-12-30 DIAGNOSIS — O99210 Obesity complicating pregnancy, unspecified trimester: Secondary | ICD-10-CM

## 2022-12-30 DIAGNOSIS — O0990 Supervision of high risk pregnancy, unspecified, unspecified trimester: Secondary | ICD-10-CM

## 2022-12-30 DIAGNOSIS — O09299 Supervision of pregnancy with other poor reproductive or obstetric history, unspecified trimester: Secondary | ICD-10-CM

## 2022-12-30 DIAGNOSIS — D649 Anemia, unspecified: Secondary | ICD-10-CM

## 2022-12-30 LAB — CERVICOVAGINAL ANCILLARY ONLY
Chlamydia: NEGATIVE
Neisseria Gonorrhea: NEGATIVE

## 2022-12-30 NOTE — Progress Notes (Signed)
   PRENATAL VISIT NOTE  Subjective:  Felicia [name] is a 32 y.o. [id number] at [redacted]w[redacted]d being seen today for ongoing prenatal care.  She is currently monitored for the following issues for this high-risk pregnancy and has History of macrosomia in infant in prior pregnancy, currently pregnant; Obesity affecting pregnancy, antepartum; Supervision of high risk pregnancy, antepartum; History of fetal anomaly in prior pregnancy, currently pregnant; Hidradenitis suppurativa; and Anemia on their problem list.  Patient reports  doing well today. Seen in MAU last week for persistent HA. Normal Bps & labs at that time. Pain resolved with flexeril .  Contractions: Not present. Vag. Bleeding: None.  Movement: Present. Denies leaking of fluid.   The following portions of the patient's history were reviewed and updated as appropriate: allergies, current medications, past family history, past medical history, past social history, past surgical history and problem list.   Objective:   Vitals:   12/30/22 0959  BP: 120/81  Pulse: 81  Weight: (!) 312 lb (141.5 kg)    Fetal Status: Fetal Heart Rate (bpm): 151   Movement: Present     General:  Alert, oriented and cooperative. Patient is in no acute distress.  Skin: Skin is warm and dry. No rash noted.   Cardiovascular: Normal heart rate noted  Respiratory: Normal respiratory effort, no problems with respiration noted  Abdomen: Soft, gravid, appropriate for gestational age.  Pain/Pressure: Absent      Assessment and Plan:  Pregnancy: G5P3013 at [redacted]w[redacted]d 1. Supervision of high risk pregnancy, antepartum 2. [redacted] weeks gestation of pregnancy Discussed headaches & BP monitoring. Reviewed preE precautions and reasons to present to MAU - Strep Gp B Culture+Rflx - Cervicovaginal ancillary only( [place])  3. Anemia, unspecified type Po iron  4. History of macrosomia in infant in prior pregnancy, currently pregnant @34 /1 2945g (96%) Next growth [place] 5/10 Pt desires  eIOL at 39 weeks - will add to list   5. Obesity affecting pregnancy, antepartum, unspecified obesity type Weekly BPPs/NSTs - last BPP 4/26 8/8, nml AFI, cephalic  Term labor symptoms and general obstetric precautions including but not limited to vaginal bleeding, contractions, leaking of fluid and fetal movement were reviewed in detail with the patient.  Please refer to After Visit Summary for other counseling recommendations.   Future Appointments  Date Time Provider Department Center  01/03/2023 10:30 AM [facility] NURSE [facility]  01/03/2023 10:45 AM [facility] NST [facility]  01/06/2023  9:50 AM Lesly Dukes, MD [facility]  01/10/2023  9:15 AM [facility] NURSE [facility]  01/10/2023  9:30 AM [facility] US2 [facility]  01/13/2023  9:30 AM Lesly Dukes, MD [facility]  01/17/2023 10:30 AM [facility] NURSE [facility]  01/17/2023 10:45 AM [facility] NST [facility]  01/20/2023  9:50 AM Leggett, Fredrich Romans, MD [facility]    Lennart Pall, MD

## 2022-12-31 LAB — CERVICOVAGINAL ANCILLARY ONLY
Comment: NEGATIVE
Comment: NORMAL

## 2023-01-03 ENCOUNTER — Ambulatory Visit: Payer: Managed Care, Other (non HMO) | Admitting: *Deleted

## 2023-01-03 ENCOUNTER — Ambulatory Visit (HOSPITAL_BASED_OUTPATIENT_CLINIC_OR_DEPARTMENT_OTHER): Payer: Managed Care, Other (non HMO) | Admitting: *Deleted

## 2023-01-03 ENCOUNTER — Other Ambulatory Visit: Payer: Self-pay | Admitting: Obstetrics and Gynecology

## 2023-01-03 VITALS — BP 118/71 | HR 73

## 2023-01-03 DIAGNOSIS — Z3A37 37 weeks gestation of pregnancy: Secondary | ICD-10-CM

## 2023-01-03 DIAGNOSIS — E669 Obesity, unspecified: Secondary | ICD-10-CM

## 2023-01-03 DIAGNOSIS — O3660X0 Maternal care for excessive fetal growth, unspecified trimester, not applicable or unspecified: Secondary | ICD-10-CM | POA: Insufficient documentation

## 2023-01-03 DIAGNOSIS — O99213 Obesity complicating pregnancy, third trimester: Secondary | ICD-10-CM | POA: Insufficient documentation

## 2023-01-03 DIAGNOSIS — O3663X0 Maternal care for excessive fetal growth, third trimester, not applicable or unspecified: Secondary | ICD-10-CM

## 2023-01-03 DIAGNOSIS — O0990 Supervision of high risk pregnancy, unspecified, unspecified trimester: Secondary | ICD-10-CM

## 2023-01-03 LAB — STREP GP B CULTURE+RFLX: Strep Gp B Culture+Rflx: NEGATIVE

## 2023-01-03 NOTE — Progress Notes (Signed)
Elective IOL orders entered prior to GBS result - will need to add abx if GBS+

## 2023-01-03 NOTE — Procedures (Signed)
Felicia [name] [birth year] [redacted]w[redacted]d  Fetus A Non-Stress Test Interpretation for 01/03/23  Indication:  obese  Fetal Heart Rate A Mode: External Baseline Rate (A): 145 bpm Variability: Moderate Accelerations: 15 x 15 Decelerations: None Multiple birth?: No  Uterine Activity Mode: Toco Contraction Frequency (min): none Resting Tone Palpated: Relaxed  Interpretation (Fetal Testing) Nonstress Test Interpretation: Reactive Overall Impression: Reassuring for gestational age Comments: Tracing reviewed by D. Darra Lis

## 2023-01-06 ENCOUNTER — Inpatient Hospital Stay (HOSPITAL_COMMUNITY): Payer: Managed Care, Other (non HMO) | Admitting: Anesthesiology

## 2023-01-06 ENCOUNTER — Inpatient Hospital Stay (HOSPITAL_COMMUNITY)
Admission: AD | Admit: 2023-01-06 | Discharge: 2023-01-08 | DRG: 807 | Disposition: A | Discharge status: Home / Self Care | Payer: Managed Care, Other (non HMO) | Attending: Family Medicine | Admitting: Family Medicine

## 2023-01-06 ENCOUNTER — Encounter (HOSPITAL_COMMUNITY): Payer: Self-pay | Admitting: Obstetrics and Gynecology

## 2023-01-06 ENCOUNTER — Ambulatory Visit (INDEPENDENT_AMBULATORY_CARE_PROVIDER_SITE_OTHER): Payer: Managed Care, Other (non HMO) | Admitting: Obstetrics & Gynecology

## 2023-01-06 VITALS — BP 139/93 | HR 80 | Wt 316.0 lb

## 2023-01-06 DIAGNOSIS — O9952 Diseases of the respiratory system complicating childbirth: Secondary | ICD-10-CM | POA: Diagnosis present

## 2023-01-06 DIAGNOSIS — Z880 Allergy status to penicillin: Secondary | ICD-10-CM

## 2023-01-06 DIAGNOSIS — Z3A37 37 weeks gestation of pregnancy: Secondary | ICD-10-CM

## 2023-01-06 DIAGNOSIS — O26893 Other specified pregnancy related conditions, third trimester: Secondary | ICD-10-CM | POA: Diagnosis present

## 2023-01-06 DIAGNOSIS — D649 Anemia, unspecified: Secondary | ICD-10-CM | POA: Diagnosis present

## 2023-01-06 DIAGNOSIS — O99210 Obesity complicating pregnancy, unspecified trimester: Secondary | ICD-10-CM

## 2023-01-06 DIAGNOSIS — O9902 Anemia complicating childbirth: Secondary | ICD-10-CM | POA: Diagnosis present

## 2023-01-06 DIAGNOSIS — Z7982 Long term (current) use of aspirin: Secondary | ICD-10-CM

## 2023-01-06 DIAGNOSIS — O9972 Diseases of the skin and subcutaneous tissue complicating childbirth: Secondary | ICD-10-CM | POA: Diagnosis present

## 2023-01-06 DIAGNOSIS — L732 Hidradenitis suppurativa: Secondary | ICD-10-CM | POA: Diagnosis present

## 2023-01-06 DIAGNOSIS — O134 Gestational [pregnancy-induced] hypertension without significant proteinuria, complicating childbirth: Principal | ICD-10-CM | POA: Diagnosis present

## 2023-01-06 DIAGNOSIS — Z7951 Long term (current) use of inhaled steroids: Secondary | ICD-10-CM | POA: Diagnosis not present

## 2023-01-06 DIAGNOSIS — O0990 Supervision of high risk pregnancy, unspecified, unspecified trimester: Secondary | ICD-10-CM

## 2023-01-06 DIAGNOSIS — O99214 Obesity complicating childbirth: Secondary | ICD-10-CM | POA: Diagnosis present

## 2023-01-06 DIAGNOSIS — J45909 Unspecified asthma, uncomplicated: Secondary | ICD-10-CM | POA: Diagnosis present

## 2023-01-06 DIAGNOSIS — O139 Gestational [pregnancy-induced] hypertension without significant proteinuria, unspecified trimester: Secondary | ICD-10-CM

## 2023-01-06 DIAGNOSIS — Z3490 Encounter for supervision of normal pregnancy, unspecified, unspecified trimester: Secondary | ICD-10-CM | POA: Diagnosis present

## 2023-01-06 LAB — CBC
HCT: 33.2 % — ABNORMAL LOW (ref 36.0–46.0)
HCT: 33.9 % — ABNORMAL LOW (ref 36.0–46.0)
Hemoglobin: 11.1 g/dL — ABNORMAL LOW (ref 12.0–15.0)
Hemoglobin: 11 g/dL — ABNORMAL LOW (ref 12.0–15.0)
MCH: 28.1 pg (ref 26.0–34.0)
MCH: 27.8 pg (ref 26.0–34.0)
MCHC: 33.4 g/dL (ref 30.0–36.0)
MCHC: 32.4 g/dL (ref 30.0–36.0)
MCV: 84.1 fL (ref 80.0–100.0)
MCV: 85.6 fL (ref 80.0–100.0)
Platelets: 238 10*3/uL (ref 150–400)
Platelets: 232 10*3/uL (ref 150–400)
RBC: 3.95 MIL/uL (ref 3.87–5.11)
RBC: 3.96 MIL/uL (ref 3.87–5.11)
RDW: 14.3 % (ref 11.5–15.5)
RDW: 14.3 % (ref 11.5–15.5)
WBC: 12.2 10*3/uL — ABNORMAL HIGH (ref 4.0–10.5)
WBC: 14.3 10*3/uL — ABNORMAL HIGH (ref 4.0–10.5)
nRBC: 0 % (ref 0.0–0.2)
nRBC: 0 % (ref 0.0–0.2)

## 2023-01-06 LAB — COMPREHENSIVE METABOLIC PANEL
ALT: 8 U/L (ref 0–44)
AST: 16 U/L (ref 15–41)
Albumin: 2 g/dL — ABNORMAL LOW (ref 3.5–5.0)
Alkaline Phosphatase: 211 U/L — ABNORMAL HIGH (ref 38–126)
Anion gap: 9 (ref 5–15)
BUN: <5 mg/dL — ABNORMAL LOW (ref 6–20)
CO2: 18 mmol/L — ABNORMAL LOW (ref 22–32)
Calcium: 8.5 mg/dL — ABNORMAL LOW (ref 8.9–10.3)
Chloride: 108 mmol/L (ref 98–111)
Creatinine, Ser: 0.54 mg/dL (ref 0.44–1.00)
GFR, Estimated: >60 mL/min (ref >60)
Glucose, Bld: 88 mg/dL (ref 70–99)
Potassium: 3.9 mmol/L (ref 3.5–5.1)
Sodium: 135 mmol/L (ref 135–145)
Total Bilirubin: 0.3 mg/dL (ref 0.3–1.2)
Total Protein: 6.4 g/dL — ABNORMAL LOW (ref 6.5–8.1)

## 2023-01-06 LAB — PROTEIN / CREATININE RATIO, URINE
Creatinine, Urine: 41 mg/dL
Protein Creatinine Ratio: 0.22 mg/mg{Cre} — ABNORMAL HIGH (ref 0.00–0.15)
Total Protein, Urine: 9 mg/dL

## 2023-01-06 LAB — TYPE AND SCREEN
ABO/RH(D): A POS
Antibody Screen: NEGATIVE

## 2023-01-06 LAB — RPR: RPR Ser Ql: NONREACTIVE

## 2023-01-06 MED ORDER — DIBUCAINE (PERIANAL) 1 % EX OINT: 1.0000 | TOPICAL_OINTMENT | CUTANEOUS | Status: DC | PRN

## 2023-01-06 MED ORDER — DIPHENHYDRAMINE HCL 50 MG/ML IJ SOLN: 12.5000 mg | INTRAMUSCULAR | Status: DC | PRN

## 2023-01-06 MED ORDER — LACTATED RINGERS IV SOLN
500.0000 mL | Freq: Once | INTRAVENOUS | Status: AC
  Administered 2023-01-06: 500 mL via INTRAVENOUS

## 2023-01-06 MED ORDER — ACETAMINOPHEN 325 MG PO TABS: 650.0000 mg | ORAL_TABLET | ORAL | Status: DC | PRN

## 2023-01-06 MED ORDER — SOD CITRATE-CITRIC ACID 500-334 MG/5ML PO SOLN: 30.0000 mL | ORAL | Status: DC | PRN

## 2023-01-06 MED ORDER — SODIUM CHLORIDE 0.9% FLUSH: 3.0000 mL | INTRAVENOUS | Status: DC | PRN

## 2023-01-06 MED ORDER — OXYTOCIN BOLUS FROM INFUSION
333.0000 mL | Freq: Once | INTRAVENOUS | Status: DC
  Administered 2023-01-06: 333 mL via INTRAVENOUS

## 2023-01-06 MED ORDER — ONDANSETRON HCL 4 MG/2ML IJ SOLN: 4.0000 mg | INTRAMUSCULAR | Status: DC | PRN

## 2023-01-06 MED ORDER — ONDANSETRON HCL 4 MG/2ML IJ SOLN
4.0000 mg | Freq: Four times a day (QID) | INTRAMUSCULAR | Status: DC | PRN
  Administered 2023-01-06: 4 mg via INTRAVENOUS
  Filled 2023-01-06: qty 2

## 2023-01-06 MED ORDER — LIDOCAINE HCL (PF) 1 % IJ SOLN: 30.0000 mL | INTRAMUSCULAR | Status: DC | PRN

## 2023-01-06 MED ORDER — SENNOSIDES-DOCUSATE SODIUM 8.6-50 MG PO TABS
2.0000 | ORAL_TABLET | ORAL | Status: DC
  Administered 2023-01-07 – 2023-01-08 (×2): 2 via ORAL
  Filled 2023-01-06 (×2): qty 2

## 2023-01-06 MED ORDER — BENZOCAINE-MENTHOL 20-0.5 % EX AERO
1.0000 | INHALATION_SPRAY | CUTANEOUS | Status: DC | PRN
  Filled 2023-01-06: qty 56

## 2023-01-06 MED ORDER — LACTATED RINGERS IV SOLN: INTRAVENOUS | Status: DC

## 2023-01-06 MED ORDER — SODIUM CHLORIDE 0.9% FLUSH: 3.0000 mL | Freq: Two times a day (BID) | INTRAVENOUS | Status: DC

## 2023-01-06 MED ORDER — EPHEDRINE 5 MG/ML INJ: 10.0000 mg | INTRAVENOUS | Status: DC | PRN

## 2023-01-06 MED ORDER — FENTANYL-BUPIVACAINE-NACL 0.5-0.125-0.9 MG/250ML-% EP SOLN
12.0000 mL/h | EPIDURAL | Status: DC | PRN
  Administered 2023-01-06: 12 mL/h via EPIDURAL
  Filled 2023-01-06: qty 250

## 2023-01-06 MED ORDER — OXYTOCIN-SODIUM CHLORIDE 30-0.9 UT/500ML-% IV SOLN
2.5000 [IU]/h | INTRAVENOUS | Status: DC
  Filled 2023-01-06: qty 500

## 2023-01-06 MED ORDER — FUROSEMIDE 20 MG PO TABS
20.0000 mg | ORAL_TABLET | Freq: Every day | ORAL | Status: DC
  Administered 2023-01-07 – 2023-01-08 (×2): 20 mg via ORAL
  Filled 2023-01-06 (×2): qty 1

## 2023-01-06 MED ORDER — IBUPROFEN 600 MG PO TABS
600.0000 mg | ORAL_TABLET | Freq: Four times a day (QID) | ORAL | Status: DC
  Administered 2023-01-07 – 2023-01-08 (×6): 600 mg via ORAL
  Filled 2023-01-06 (×6): qty 1

## 2023-01-06 MED ORDER — MISOPROSTOL 50MCG HALF TABLET
50.0000 ug | ORAL_TABLET | Freq: Once | ORAL | Status: AC
  Administered 2023-01-06: 50 ug via ORAL

## 2023-01-06 MED ORDER — TERBUTALINE SULFATE 1 MG/ML IJ SOLN: 0.2500 mg | Freq: Once | INTRAMUSCULAR | Status: DC | PRN

## 2023-01-06 MED ORDER — SODIUM CHLORIDE 0.9 % IV SOLN: 250.0000 mL | INTRAVENOUS | Status: DC | PRN

## 2023-01-06 MED ORDER — ONDANSETRON HCL 4 MG PO TABS: 4.0000 mg | ORAL_TABLET | ORAL | Status: DC | PRN

## 2023-01-06 MED ORDER — LACTATED RINGERS IV SOLN: 500.0000 mL | INTRAVENOUS | Status: DC | PRN

## 2023-01-06 MED ORDER — SIMETHICONE 80 MG PO CHEW: 80.0000 mg | CHEWABLE_TABLET | ORAL | Status: DC | PRN

## 2023-01-06 MED ORDER — COCONUT OIL OIL: 1.0000 | TOPICAL_OIL | Status: DC | PRN

## 2023-01-06 MED ORDER — WITCH HAZEL-GLYCERIN EX PADS: 1.0000 | MEDICATED_PAD | CUTANEOUS | Status: DC | PRN

## 2023-01-06 MED ORDER — PHENYLEPHRINE 80 MCG/ML (10ML) SYRINGE FOR IV PUSH (FOR BLOOD PRESSURE SUPPORT): 80.0000 ug | PREFILLED_SYRINGE | INTRAVENOUS | Status: DC | PRN

## 2023-01-06 MED ORDER — PRENATAL MULTIVITAMIN CH
1.0000 | ORAL_TABLET | Freq: Every day | ORAL | Status: DC
  Administered 2023-01-07: 1 via ORAL
  Filled 2023-01-06: qty 1

## 2023-01-06 MED ORDER — EPHEDRINE 5 MG/ML INJ
10.0000 mg | INTRAVENOUS | Status: DC | PRN
  Filled 2023-01-06: qty 5

## 2023-01-06 MED ORDER — LIDOCAINE-EPINEPHRINE (PF) 1.5 %-1:200000 IJ SOLN
INTRAMUSCULAR | Status: DC | PRN
  Administered 2023-01-06: 5 mL via EPIDURAL

## 2023-01-06 MED ORDER — DIPHENHYDRAMINE HCL 25 MG PO CAPS: 25.0000 mg | ORAL_CAPSULE | Freq: Four times a day (QID) | ORAL | Status: DC | PRN

## 2023-01-06 MED ORDER — ZOLPIDEM TARTRATE 5 MG PO TABS: 5.0000 mg | ORAL_TABLET | Freq: Every evening | ORAL | Status: DC | PRN

## 2023-01-06 MED ORDER — FENTANYL CITRATE (PF) 100 MCG/2ML IJ SOLN: 50.0000 ug | INTRAMUSCULAR | Status: DC | PRN

## 2023-01-06 MED ORDER — PHENYLEPHRINE 80 MCG/ML (10ML) SYRINGE FOR IV PUSH (FOR BLOOD PRESSURE SUPPORT)
80.0000 ug | PREFILLED_SYRINGE | INTRAVENOUS | Status: DC | PRN
  Filled 2023-01-06: qty 10

## 2023-01-06 MED ORDER — OXYTOCIN-SODIUM CHLORIDE 30-0.9 UT/500ML-% IV SOLN
1.0000 m[IU]/min | INTRAVENOUS | Status: DC
  Administered 2023-01-06: 2 m[IU]/min via INTRAVENOUS

## 2023-01-06 MED ORDER — ACETAMINOPHEN 325 MG PO TABS
650.0000 mg | ORAL_TABLET | ORAL | Status: DC | PRN
  Administered 2023-01-07: 650 mg via ORAL
  Filled 2023-01-06: qty 2

## 2023-01-06 MED ORDER — MISOPROSTOL 25 MCG QUARTER TABLET
25.0000 ug | ORAL_TABLET | Freq: Once | ORAL | Status: AC
  Administered 2023-01-06: 25 ug via VAGINAL

## 2023-01-06 NOTE — Anesthesia Preprocedure Evaluation (Signed)
Anesthesia Evaluation  Patient identified by MRN, date of birth, ID band Patient awake    Reviewed: Allergy & Precautions, NPO status , Patient's Chart, lab work & pertinent test results  Airway Mallampati: II  TM Distance: >3 FB Neck ROM: Full    Dental no notable dental hx.    Pulmonary asthma    Pulmonary exam normal        Cardiovascular negative cardio ROS  Rhythm:Regular Rate:Normal     Neuro/Psych negative neurological ROS  negative psych ROS   GI/Hepatic negative GI ROS, Neg liver ROS,,,  Endo/Other  negative endocrine ROS  Morbid obesity  Renal/GU negative Renal ROS  negative genitourinary   Musculoskeletal negative musculoskeletal ROS (+)    Abdominal Normal abdominal exam  (+)   Peds  Hematology  (+) Blood dyscrasia, anemia   Anesthesia Other Findings   Reproductive/Obstetrics (+) Pregnancy                             Anesthesia Physical Anesthesia Plan  ASA: 3  Anesthesia Plan: Epidural   Post-op Pain Management:    Induction:   PONV Risk Score and Plan: 2 and Treatment may vary due to age or medical condition  Airway Management Planned: Natural Airway  Additional Equipment: None  Intra-op Plan:   Post-operative Plan:   Informed Consent: I have reviewed the patients History and Physical, chart, labs and discussed the procedure including the risks, benefits and alternatives for the proposed anesthesia with the patient or authorized representative who has indicated his/her understanding and acceptance.     Dental advisory given  Plan Discussed with:   Anesthesia Plan Comments:        Anesthesia Quick Evaluation

## 2023-01-06 NOTE — H&P (Signed)
OBSTETRIC ADMISSION HISTORY AND PHYSICAL  Felicia [name] is a 32 y.o. female [phone] with IUP at [redacted]w[redacted]d by LMP presenting for IOL. She reports +FMs, No LOF, no VB, no blurry vision or RUQ pain. She has a mild headache that is improving.  She plans on bottle feeding. She request OCPs/vasectomy for birth control. She received her prenatal care at  [facility]    Dating: By LMP --->  Estimated Date of Delivery: 01/22/23  Sono:    @[redacted]w[redacted]d , CWD, normal anatomy, cephalic presentation, posterior placental lie, 2945g, 96% EFW   Prenatal History/Complications:  -gHTN -Anemia -hx of macrosomia -hx of fetal anomaly in prior pregnancy  Past Medical History: Past Medical History:  Diagnosis Date   Asthma    Hidradenitis suppurativa     Past Surgical History: Past Surgical History:  Procedure Laterality Date   CHOLECYSTECTOMY  2011   DILATION AND EVACUATION N/A 03/07/2015   Procedure: DILATATION AND EVACUATION;  Surgeon: Levi Aland, MD;  Location: [facility];  Service: Gynecology;  Laterality: N/A;    Obstetrical History: OB History     Gravida  5   Para  3   Term  3   Preterm      AB  1   Living  3      SAB  1   IAB      Ectopic      Multiple  0   Live Births  3           Social History Social History   Socioeconomic History   Marital status: Married    Spouse name: Not on file   Number of children: Not on file   Years of education: Not on file   Highest education level: Not on file  Occupational History   Not on file  Tobacco Use   Smoking status: Never   Smokeless tobacco: Never  Vaping Use   Vaping Use: Never used  Substance and Sexual Activity   Alcohol use: No   Drug use: No   Sexual activity: Yes    Birth control/protection: None  Other Topics Concern   Not on file  Social History Narrative   Not on file   Social Determinants of Health   Financial Resource Strain: Not on file  Food Insecurity: Not on file  Transportation Needs: Not on file   Physical Activity: Not on file  Stress: Not on file  Social Connections: Not on file    Family History: Family History  Problem Relation Age of Onset   Diabetes Mother    Hypertension Father     Allergies: Allergies  Allergen Reactions   Amoxicillin     "It does something to my liver enzymes and my skin turns yellow."    Medications Prior to Admission  Medication Sig Dispense Refill Last Dose   acetaminophen (TYLENOL) 500 MG tablet Take 500 mg by mouth daily as needed for mild pain or moderate pain.      aspirin EC 81 MG tablet Take 1 tablet (81 mg total) by mouth daily. Swallow whole. 30 tablet 12    cyclobenzaprine (FLEXERIL) 10 MG tablet Take 1 tablet (10 mg total) by mouth 2 (two) times daily as needed for muscle spasms. (Patient not taking: Reported on 01/03/2023) 20 tablet 0    famotidine (PEPCID) 20 MG tablet Take 1 tablet (20 mg total) by mouth 2 (two) times daily. 60 tablet 3    ferrous sulfate 325 (65 FE) MG EC tablet Take  1 tablet (325 mg total) by mouth every other day. 90 tablet 1    fluticasone (FLOVENT HFA) 44 MCG/ACT inhaler Inhale 2 puffs into the lungs 2 (two) times daily. 1 each 2    Prenatal Vit-Fe Fumarate-FA (PRENATAL MULTIVITAMIN) TABS tablet Take 1 tablet by mouth at bedtime.        Review of Systems   All systems reviewed and negative except as stated in HPI  Last menstrual period 04/17/2022, unknown if currently breastfeeding. General appearance: alert and no distress Lungs: normal effort Heart: regular rate Abdomen: gravid Pelvic: see below Extremities: Mild LE edema Presentation: cephalic Fetal monitoringBaseline: 150 bpm, Variability: Good {> 6 bpm), Accelerations: Reactive, and Decelerations: Absent Uterine activityNone Dilation: Fingertip Effacement (%): Thick Station: -3 Exam by:: Sherol Dade, RN   Prenatal labs: ABO, Rh: A/RH(D) POSITIVE/-- (11/07 1040) Antibody: NO ANTIBODIES DETECTED (11/07 1040) Rubella: 11.60 (11/07  1040) RPR: Non Reactive (03/04 0935)  HBsAg: NON-REACTIVE (11/07 1040)  HIV: Non Reactive (03/04 0935)  GBS: Negative/-- (04/29 0000)  1 hr Glucola 139 Genetic screening  LR Anatomy US wnl, Female  Prenatal Transfer Tool  Maternal Diabetes: No Genetic Screening: Normal Maternal Ultrasounds/Referrals: Normal Fetal Ultrasounds or other Referrals:  None Maternal Substance Abuse:  No Significant Maternal Medications:  None Significant Maternal Lab Results:  Group B Strep negative Number of Prenatal Visits:greater than 3 verified prenatal visits Other Comments:  None  No results found for this or any previous visit (from the past 24 hour(s)).  Patient Active Problem List   Diagnosis Date Noted   Encounter for elective induction of labor 01/06/2023   Anemia 11/05/2022   Hidradenitis suppurativa 08/05/2022   History of macrosomia in infant in prior pregnancy, currently pregnant 07/09/2022   Obesity affecting pregnancy, antepartum 07/09/2022   Supervision of high risk pregnancy, antepartum 07/09/2022   History of fetal anomaly in prior pregnancy, currently pregnant 07/09/2022    Assessment/Plan:  Felicia [name] is a 32 y.o. [id number] at [redacted]w[redacted]d here for IOL 2/2 gHTN v. preE.  #Labor: Dual cytotec 50/25. Consider FB at next cervical exam.  #Pain: Planning for epidural #FWB: Cat I  #ID: GBS neg #MOF: Bottle #MOC: OCP/vasectomy #Circ: n/a, girl  gHTN v. PreE BP elevated. Mild headache, improving without medication.  -CBC/CMP/UPC pending  Lavonda Jumbo, DO  01/06/2023, 12:30 PM

## 2023-01-06 NOTE — Discharge Summary (Signed)
Postpartum Discharge Summary  Date of Service updated***     Patient Name: LASONYA OHAIRE DOB: 06/23/1991 MRN: [id number]  Date of admission: 01/06/2023 Delivery date:01/06/2023  Delivering provider:   Date of discharge: 01/06/2023  Admitting diagnosis: Encounter for elective induction of labor [Z34.90] Intrauterine pregnancy: [redacted]w[redacted]d     Secondary diagnosis:  Principal Problem:   gestational hypertension  Additional problems: ***    Discharge diagnosis: {DX.:23714}                                              Post partum procedures:{Postpartum procedures:23558} Augmentation: {Augmentation:20782} Complications: None  Hospital course: Induction of Labor With Vaginal Delivery   32 y.o. yo [id number] at [redacted]w[redacted]d was admitted to the hospital 01/06/2023 for induction of labor.  Indication for induction: Gestational hypertension.  Patient had an labor course was  uncomplicated Membrane Rupture Time/Date: 5:51 PM ,01/06/2023   Delivery Method:Vaginal, Spontaneous  Episiotomy:   Lacerations:  1st degree;Perineal  Details of delivery can be found in separate delivery note.  Patient had a postpartum course was uncomplicated. Patient is discharged home 01/06/23.  Newborn Data: Birth date:01/06/2023  Birth time:7:46 PM  Gender:Female  Living status:Living  Apgars:9 ,9  Weight:   Magnesium Sulfate received: No BMZ received: No Rhophylac:No MMR:{MMR:30440033} T-DaP:{Tdap:23962} Flu: {QMV:78469} Transfusion:{Transfusion received:30440034}  Physical exam  Vitals:   01/06/23 1906 01/06/23 1930 01/06/23 2000 01/06/23 2020  BP: (!) 141/82 134/74 135/87 (!) 149/56  Pulse: 86 87 96 92  Resp:  18 18   Temp:      TempSrc:      Weight:      Height:       General: {Exam; general:21111117} Lochia: {Desc; appropriate/inappropriate:30686::"appropriate"} Uterine Fundus: {Desc; firm/soft:30687} Incision: {Exam; incision:21111123} DVT Evaluation: {Exam; dvt:2111122} Labs: Lab Results  Component  Value Date   WBC 12.2 (H) 01/06/2023   HGB 11.1 (L) 01/06/2023   HCT 33.2 (L) 01/06/2023   MCV 84.1 01/06/2023   PLT 238 01/06/2023      Latest Ref Rng & Units 01/06/2023   12:00 PM  CMP  Glucose 70 - 99 mg/dL 88   BUN 6 - 20 mg/dL <5   Creatinine 6.29 - 1.00 mg/dL 5.28   Sodium 413 - 244 mmol/L 135   Potassium 3.5 - 5.1 mmol/L 3.9   Chloride 98 - 111 mmol/L 108   CO2 22 - 32 mmol/L 18   Calcium 8.9 - 10.3 mg/dL 8.5   Total Protein 6.5 - 8.1 g/dL 6.4   Total Bilirubin 0.3 - 1.2 mg/dL 0.3   Alkaline Phos 38 - 126 U/L 211   AST 15 - 41 U/L 16   ALT 0 - 44 U/L 8    Edinburgh Score:     No data to display           After visit meds:  Allergies as of 01/06/2023       Reactions   Amoxicillin    "It does something to my liver enzymes and my skin turns yellow."     Med Rec must be completed prior to using this [facility]***        Discharge home in stable condition Infant Feeding: {Baby feeding:23562} Infant Disposition:{CHL IP OB HOME WITH WNUUVO:53664} Discharge instruction: per After Visit Summary and Postpartum booklet. Activity: Advance as tolerated. Pelvic rest for 6 weeks.  Diet: {OB  ZOXW:96045409} Future Appointments: Future Appointments  Date Time Provider Department Center  01/13/2023  9:30 AM Lesly Dukes, MD [facility]  01/20/2023  9:50 AM Penne Lash, Fredrich Romans, MD [facility]   Follow up Visit:  Message sent to [facility] by Autry-Lott on 01/06/2023  Please schedule this patient for a In person postpartum visit in 6 weeks with the following provider: Any provider. Additional Postpartum F/U:BP check 1 week  High risk pregnancy complicated by:  obesity, gHTN Delivery mode:  Vaginal, Spontaneous  Anticipated Birth Control:   OCP interval for husband's vasectomy   01/06/2023 Randa Evens Autry-Lott, DO

## 2023-01-06 NOTE — Progress Notes (Signed)
   PRENATAL VISIT NOTE  Subjective:  Felicia [name] is a 32 y.o. [id number] at [redacted]w[redacted]d being seen today for ongoing prenatal care.  She is currently monitored for the following issues for this high-risk pregnancy and has History of macrosomia in infant in prior pregnancy, currently pregnant; Obesity affecting pregnancy, antepartum; Supervision of high risk pregnancy, antepartum; History of fetal anomaly in prior pregnancy, currently pregnant; Hidradenitis suppurativa; and Anemia on their problem list.  Patient reports headache and brwon spotting yesterday .  Contractions: Irritability. Vag. Bleeding: None.  Movement: Present. Denies leaking of fluid.   The following portions of the patient's history were reviewed and updated as appropriate: allergies, current medications, past family history, past medical history, past social history, past surgical history and problem list.   Objective:   Vitals:   01/06/23 0953  BP: (!) 139/93  Pulse: 80  Weight: (!) 316 lb (143.3 kg)    Fetal Status: Fetal Heart Rate (bpm): 147   Movement: Present     General:  Alert, oriented and cooperative. Patient is in no acute distress.  Skin: Skin is warm and dry. No rash noted.   Cardiovascular: Normal heart rate noted  Respiratory: Normal respiratory effort, no problems with respiration noted  Abdomen: Soft, gravid, appropriate for gestational age.  Pain/Pressure: Absent     Pelvic: Cervical exam deferred        Extremities: Normal range of motion.     Mental Status: Normal mood and affect. Normal behavior. Normal judgment and thought content.   Assessment and Plan:  Pregnancy: A5W0981 at [redacted]w[redacted]d  1. Gestational Htn Pt has home elevated BP yesterday 150/90 and elevated today with headache. Pt had 4 lb weight gain this week and can't wear her rings any longer.  Will induce for GHTN and will need to r/o Pre E when she arrives to hospital.  Called L & D and gave report to Erven Colla.  She will call patient in  the next few hours when bed is available with goal to start induction today.    Term labor symptoms and general obstetric precautions including but not limited to vaginal bleeding, contractions, leaking of fluid and fetal movement were reviewed in detail with the patient. Please refer to After Visit Summary for other counseling recommendations.   No follow-ups on file.  Future Appointments  Date Time Provider Department Center  01/10/2023  9:15 AM [facility] NURSE [facility]  01/10/2023  9:30 AM [facility] US2 [facility]  01/13/2023  9:30 AM Lesly Dukes, MD [facility]  01/17/2023 10:30 AM [facility] NURSE [facility]  01/17/2023 10:45 AM [facility] NST [facility]  01/20/2023  9:50 AM Lesly Dukes, MD [facility]  01/29/2023  6:30 AM [facility] ROOM [facility] None    Elsie Lincoln, MD

## 2023-01-06 NOTE — Progress Notes (Signed)
Labor Progress Note Felicia [name] is a 32 y.o. Z6X0960 at [redacted]w[redacted]d presented for IOL.  S: No acute concerns.   O:  BP 98/60   Pulse 86   Temp 98.2 F (36.8 C) (Oral)   Resp 16   Ht 5\' 7"  (1.702 m)   Wt (!) 143.4 kg   LMP 04/17/2022   BMI 49.52 kg/m  EFM: 135bpm/moderate/+accels, no decels  CVE: Dilation: 5 Effacement (%): 50 Station: -3 Presentation: Vertex Exam by:: Dr. Salvadore Dom  A&P: 32 y.o. A5W0981 [redacted]w[redacted]d here for IOL 2/2 gHTN v. PreE #Labor: s/p AROM, clear fluid. On 4 of pitocin. Continue to titrate up on pitocin.  #Pain: Epidural #FWB: Cat I  #GBS negative  gHTN v. PreE BP wnl after epidural. UPC pending. CBC/CMP wnl.  Mazella Deen Autry-Lott, DO 5:56 PM

## 2023-01-06 NOTE — Anesthesia Procedure Notes (Signed)
Epidural Patient location during procedure: OB Start time: 01/06/2023 1:45 PM End time: 01/06/2023 1:52 PM  Staffing Anesthesiologist: Atilano Median, DO Performed: anesthesiologist   Preanesthetic Checklist Completed: patient identified, IV checked, site marked, risks and benefits discussed, surgical consent, monitors and equipment checked, pre-op evaluation and timeout performed  Epidural Patient position: sitting Prep: ChloraPrep Patient monitoring: heart rate, continuous pulse ox and blood pressure Approach: midline Location: L4-L5 Injection technique: LOR saline  Needle:  Needle type: Tuohy  Needle gauge: 17 G Needle length: 9 cm Needle insertion depth: 9 cm Catheter type: closed end flexible Catheter size: 20 Guage Catheter at skin depth: 15 cm Test dose: negative and 1.5% lidocaine with Epi 1:200 K  Assessment Events: blood not aspirated, no cerebrospinal fluid, injection not painful, no injection resistance and no paresthesia  Additional Notes Patient identified. Risks/Benefits/Options discussed with patient including but not limited to bleeding, infection, nerve damage, paralysis, failed block, incomplete pain control, headache, blood pressure changes, nausea, vomiting, reactions to medications, itching and postpartum back pain. Confirmed with bedside nurse the patient's most recent platelet count. Confirmed with patient that they are not currently taking any anticoagulation, have any bleeding history or any family history of bleeding disorders. Patient expressed understanding and wished to proceed. All questions were answered. Sterile technique was used throughout the entire procedure. Please see nursing notes for vital signs. Test dose was given through epidural catheter and negative prior to continuing to dose epidural or start infusion. Warning signs of high block given to the patient including shortness of breath, tingling/numbness in hands, complete motor block, or  any concerning symptoms with instructions to call for help. Patient was given instructions on fall risk and not to get out of bed. All questions and concerns addressed with instructions to call with any issues or inadequate analgesia.    Reason for block:procedure for pain

## 2023-01-06 NOTE — Progress Notes (Signed)
Labor Progress Note Felicia [name] is a 32 y.o. Z6X0960 at [redacted]w[redacted]d presented for IOL.  S: No acute concerns.   O:  BP (!) 146/76   Pulse 82   Temp 98.2 F (36.8 C) (Oral)   Resp 16   Ht 5\' 7"  (1.702 m)   Wt (!) 143.4 kg   LMP 04/17/2022   BMI 49.52 kg/m  EFM: 130bpm/moderate/+accels, no decels  CVE: Dilation: Fingertip Effacement (%): Thick Station: -3 Presentation: Vertex Exam by:: Sherol Dade, RN  A&P: 32 y.o. [phone] [redacted]w[redacted]d here for IOL 2/2 gHTN v. PreE #Labor: Cervical exam is very soft. External os is 2cm internal os feels like 1cm. FB placed. Low dose pitocin started. Cap at 6 until FB out. AROM at next exam.  #Pain: Epidural #FWB: Cat I  #GBS negative  gHTN v. PreE BP wnl after epidural. UPC pending. CBC/CMP wnl.  Felicia Savitt Autry-Lott, DO 4:20 PM

## 2023-01-06 NOTE — Plan of Care (Signed)
  Problem: Education: Goal: Knowledge of General Education information will improve Description: Including pain rating scale, medication(s)/side effects and non-pharmacologic comfort measures Outcome: Progressing   Problem: Health Behavior/Discharge Planning: Goal: Ability to manage health-related needs will improve Outcome: Progressing   Problem: Clinical Measurements: Goal: Ability to maintain clinical measurements within normal limits will improve Outcome: Progressing Goal: Will remain free from infection Outcome: Progressing Goal: Diagnostic test results will improve Outcome: Progressing Goal: Respiratory complications will improve Outcome: Progressing Goal: Cardiovascular complication will be avoided Outcome: Progressing   Problem: Nutrition: Goal: Adequate nutrition will be maintained Outcome: Progressing   Problem: Coping: Goal: Level of anxiety will decrease Outcome: Progressing   Problem: Elimination: Goal: Will not experience complications related to bowel motility Outcome: Progressing Goal: Will not experience complications related to urinary retention Outcome: Progressing   Problem: Pain Managment: Goal: General experience of comfort will improve Outcome: Progressing   Problem: Safety: Goal: Ability to remain free from injury will improve Outcome: Progressing   Problem: Skin Integrity: Goal: Risk for impaired skin integrity will decrease Outcome: Progressing   Problem: Education: Goal: Knowledge of Childbirth will improve Outcome: Progressing Goal: Ability to make informed decisions regarding treatment and plan of care will improve Outcome: Progressing Goal: Ability to state and carry out methods to decrease the pain will improve Outcome: Progressing Goal: Individualized Educational Video(s) Outcome: Progressing   Problem: Coping: Goal: Ability to verbalize concerns and feelings about labor and delivery will improve Outcome: Progressing    Problem: Life Cycle: Goal: Ability to make normal progression through stages of labor will improve Outcome: Progressing Goal: Ability to effectively push during vaginal delivery will improve Outcome: Progressing   Problem: Role Relationship: Goal: Will demonstrate positive interactions with the child Outcome: Progressing   Problem: Safety: Goal: Risk of complications during labor and delivery will decrease Outcome: Progressing   Problem: Pain Management: Goal: Relief or control of pain from uterine contractions will improve Outcome: Progressing   Problem: Education: Goal: Knowledge of Childbirth will improve Outcome: Progressing Goal: Ability to make informed decisions regarding treatment and plan of care will improve Outcome: Progressing Goal: Ability to state and carry out methods to decrease the pain will improve Outcome: Progressing Goal: Individualized Educational Video(s) Outcome: Progressing   Problem: Coping: Goal: Ability to verbalize concerns and feelings about labor and delivery will improve Outcome: Progressing   Problem: Life Cycle: Goal: Ability to make normal progression through stages of labor will improve Outcome: Progressing Goal: Ability to effectively push during vaginal delivery will improve Outcome: Progressing   Problem: Safety: Goal: Risk of complications during labor and delivery will decrease Outcome: Progressing   Problem: Pain Management: Goal: Relief or control of pain from uterine contractions will improve Outcome: Progressing

## 2023-01-07 ENCOUNTER — Encounter: Payer: Self-pay | Admitting: Obstetrics & Gynecology

## 2023-01-07 LAB — CBC
HCT: 29.1 % — ABNORMAL LOW (ref 36.0–46.0)
Hemoglobin: 9.7 g/dL — ABNORMAL LOW (ref 12.0–15.0)
MCH: 28.4 pg (ref 26.0–34.0)
MCHC: 33.3 g/dL (ref 30.0–36.0)
MCV: 85.1 fL (ref 80.0–100.0)
Platelets: 192 10*3/uL (ref 150–400)
RBC: 3.42 MIL/uL — ABNORMAL LOW (ref 3.87–5.11)
RDW: 14.3 % (ref 11.5–15.5)
WBC: 13 10*3/uL — ABNORMAL HIGH (ref 4.0–10.5)
nRBC: 0 % (ref 0.0–0.2)

## 2023-01-07 MED ORDER — SOD CITRATE-CITRIC ACID 500-334 MG/5ML PO SOLN
30.0000 mL | Freq: Once | ORAL | Status: AC
  Administered 2023-01-07: 30 mL via ORAL
  Filled 2023-01-07: qty 30

## 2023-01-07 MED ORDER — FERROUS SULFATE 325 (65 FE) MG PO TABS
325.0000 mg | ORAL_TABLET | ORAL | Status: DC
  Administered 2023-01-08: 325 mg via ORAL
  Filled 2023-01-07: qty 1

## 2023-01-07 MED ORDER — FAMOTIDINE 20 MG PO TABS
20.0000 mg | ORAL_TABLET | Freq: Two times a day (BID) | ORAL | Status: DC
  Administered 2023-01-07 – 2023-01-08 (×3): 20 mg via ORAL
  Filled 2023-01-07 (×3): qty 1

## 2023-01-07 NOTE — Anesthesia Postprocedure Evaluation (Signed)
Anesthesia Post Note  Patient: Counselling psychologist  Procedure(s) Performed: AN AD HOC LABOR EPIDURAL     Patient location during evaluation: Mother Baby Anesthesia Type: Epidural Level of consciousness: awake Pain management: satisfactory to patient Vital Signs Assessment: post-procedure vital signs reviewed and stable Respiratory status: spontaneous breathing Cardiovascular status: stable Anesthetic complications: no  No notable events documented.  Last Vitals:  Vitals:   01/07/23 0204 01/07/23 0510  BP: 113/60 119/61  Pulse: 68 68  Resp: 20 20  Temp: 36.5 C 36.7 C  SpO2: 100% 98%    Last Pain:  Vitals:   01/07/23 0511  TempSrc:   PainSc: 0-No pain   Pain Goal: Patients Stated Pain Goal: 0 (01/06/23 1307)                 Cephus Shelling

## 2023-01-07 NOTE — Progress Notes (Signed)
Daily Post Partum Note  01/07/2023 Jenene Slicker Mcbain is a 32 y.o. Z6X0960 PPD#1 s/p  SVD/1st (repaired)  at [redacted]w[redacted]d.  Pregnancy c/b IOL for GHTN. BMI 40s 24hr/overnight events:  none  Subjective:  No s/s of pre-eclampsia; pt meeting all pp goals.   Objective:   Vitals:   01/07/23 0204 01/07/23 0510 01/07/23 0915 01/07/23 1414  BP: 113/60 119/61 120/68 128/79  Pulse: 68 68 65 70  Resp: 20 20 18 18   Temp: 97.7 F (36.5 C) 98 F (36.7 C) 98.1 F (36.7 C) 97.9 F (36.6 C)  TempSrc: Oral Oral Oral Oral  SpO2: 100% 98%  98%  Weight:      Height:        Current Vital Signs 24h Vital Sign Ranges  T 97.9 F (36.6 C) Temp  Avg: 98.3 F (36.8 C)  Min: 97.7 F (36.5 C)  Max: 99 F (37.2 C)  BP 128/79 BP  Min: 92/54  Max: 149/56  HR 70 Pulse  Avg: 85  Min: 65  Max: 100  RR 18 Resp  Avg: 18  Min: 16  Max: 20  SaO2 98 %   SpO2  Avg: 98.7 %  Min: 98 %  Max: 100 %       24 Hour I/O Current Shift I/O  Time Ins Outs 05/06 0701 - 05/07 0700 In: -  Out: 675 [Urine:350] No intake/output data recorded.    General: NAD Abdomen: obese, soft, nttp.  Perineum: deferred Skin:  Warm and dry.  Respiratory:  Normal respiratory effort Extremities: 2+ b/l LE edema  Medications Current Facility-Administered Medications  Medication Dose Route Frequency Provider Last Rate Last Admin   sodium chloride flush (NS) 0.9 % injection 3 mL  3 mL Intravenous Q12H Mahmood, Atif, MD       And   sodium chloride flush (NS) 0.9 % injection 3 mL  3 mL Intravenous PRN Vonna Drafts, MD       And   0.9 %  sodium chloride infusion  250 mL Intravenous PRN Vonna Drafts, MD       acetaminophen (TYLENOL) tablet 650 mg  650 mg Oral Q4H PRN Vonna Drafts, MD   650 mg at 01/07/23 1214   benzocaine-Menthol (DERMOPLAST) 20-0.5 % topical spray 1 Application  1 Application Topical PRN Vonna Drafts, MD       coconut oil  1 Application Topical PRN Vonna Drafts, MD       witch hazel-glycerin (TUCKS) pad 1 Application  1  Application Topical PRN Vonna Drafts, MD       And   dibucaine (NUPERCAINAL) 1 % rectal ointment 1 Application  1 Application Rectal PRN Vonna Drafts, MD       diphenhydrAMINE (BENADRYL) capsule 25 mg  25 mg Oral Q6H PRN Vonna Drafts, MD       famotidine (PEPCID) tablet 20 mg  20 mg Oral BID Vonna Drafts, MD   20 mg at 01/07/23 1225   furosemide (LASIX) tablet 20 mg  20 mg Oral Daily Autry-Lott, Simone, DO   20 mg at 01/07/23 0934   ibuprofen (ADVIL) tablet 600 mg  600 mg Oral Q6H Mahmood, Atif, MD   600 mg at 01/07/23 1533   ondansetron (ZOFRAN) tablet 4 mg  4 mg Oral Q4H PRN Vonna Drafts, MD       Or   ondansetron (ZOFRAN) injection 4 mg  4 mg Intravenous Q4H PRN Vonna Drafts, MD       prenatal multivitamin tablet 1  tablet  1 tablet Oral Q1200 Vonna Drafts, MD   1 tablet at 01/07/23 1209   senna-docusate (Senokot-S) tablet 2 tablet  2 tablet Oral Q24H Vonna Drafts, MD   2 tablet at 01/07/23 0934   simethicone (MYLICON) chewable tablet 80 mg  80 mg Oral PRN Vonna Drafts, MD       zolpidem (AMBIEN) tablet 5 mg  5 mg Oral QHS PRN Vonna Drafts, MD        Labs:  Recent Labs  Lab 01/06/23 1200 01/06/23 2126 01/07/23 0550  WBC 12.2* 14.3* 13.0*  HGB 11.1* 11.0* 9.7*  HCT 33.2* 33.9* 29.1*  PLT 238 232 192   Recent Labs  Lab 01/06/23 1200  NA 135  K 3.9  CL 108  CO2 18*  BUN <5*  CREATININE 0.54  GLUCOSE 88  CALCIUM 8.5*    Assessment & Plan:  Patient doing well *Postpartum/postop: routine care. Iron ordered. A POS/rpr neg/girl/ocps *GHTN:  continue on lasix only.  *Dispo: likely tomorrow.    Cornelia Copa MD Attending Center for [facility])

## 2023-01-08 MED ORDER — IBUPROFEN 600 MG PO TABS: 600.0000 mg | ORAL_TABLET | Freq: Four times a day (QID) | ORAL | 0 refills | Status: AC

## 2023-01-08 MED ORDER — FUROSEMIDE 20 MG PO TABS: 20.0000 mg | ORAL_TABLET | Freq: Every day | ORAL | 0 refills | Status: DC

## 2023-01-08 MED ORDER — ACETAMINOPHEN 325 MG PO TABS: 650.0000 mg | ORAL_TABLET | ORAL | 0 refills | Status: AC | PRN

## 2023-01-08 NOTE — Plan of Care (Signed)
adequate

## 2023-01-10 ENCOUNTER — Ambulatory Visit: Payer: Managed Care, Other (non HMO)

## 2023-01-13 ENCOUNTER — Encounter: Payer: Self-pay | Admitting: Obstetrics & Gynecology

## 2023-01-13 ENCOUNTER — Ambulatory Visit (INDEPENDENT_AMBULATORY_CARE_PROVIDER_SITE_OTHER): Payer: Managed Care, Other (non HMO) | Admitting: Obstetrics & Gynecology

## 2023-01-13 VITALS — BP 138/88 | HR 79 | Ht 67.0 in

## 2023-01-13 DIAGNOSIS — O165 Unspecified maternal hypertension, complicating the puerperium: Secondary | ICD-10-CM

## 2023-01-13 DIAGNOSIS — Q315 Congenital laryngomalacia: Secondary | ICD-10-CM | POA: Insufficient documentation

## 2023-01-13 MED ORDER — HYDROCHLOROTHIAZIDE 25 MG PO TABS: 25.0000 mg | ORAL_TABLET | Freq: Every day | ORAL | 0 refills | Status: DC

## 2023-01-13 NOTE — Progress Notes (Signed)
Pt has swelling in feet.

## 2023-01-13 NOTE — Progress Notes (Signed)
   Subjective:    Patient ID: Felicia [name], female    DOB: 08/30/1991, 32 y.o.   MRN: [id number]  HPI  32 yo [id number] s/p NSVD for GHTN.  Pt having bilateral foot swelling with left slightly increased on the dorsal side. No HA, scotmata, RUQ pain.  Pt took Lasix for 5 days.  No other meds.   Review of Systems  Constitutional: Negative.   Respiratory: Negative.    Cardiovascular: Negative.   Gastrointestinal: Negative.   Genitourinary: Negative.   Musculoskeletal:        Feet swelling       Objective:   Physical Exam Vitals reviewed.  Constitutional:      General: She is not in acute distress.    Appearance: She is well-developed.  HENT:     Head: Normocephalic and atraumatic.  Eyes:     Conjunctiva/sclera: Conjunctivae normal.  Cardiovascular:     Rate and Rhythm: Normal rate.  Pulmonary:     Effort: Pulmonary effort is normal.  Musculoskeletal:        General: Swelling present.     Comments: Symmetrical; no signs of DVT.  Measures 44 cm each calf  Skin:    General: Skin is warm and dry.  Neurological:     Mental Status: She is alert and oriented to person, place, and time.  Psychiatric:        Mood and Affect: Mood normal.    Vitals:   01/13/23 0934  BP: 138/88  Pulse: 79  Height: 5\' 7"  (1.702 m)       Assessment & Plan:  32 yo female for BP check    Some elvations at hom and LE swelling; No DVT; will give HCTZ 25 mg daily for 1 week.   Take BP daily and report 140>90; was not put on pp Baby Rx RTX for routine pp appt.

## 2023-01-15 ENCOUNTER — Other Ambulatory Visit: Payer: Self-pay | Admitting: Obstetrics & Gynecology

## 2023-01-15 ENCOUNTER — Encounter: Payer: Self-pay | Admitting: Obstetrics & Gynecology

## 2023-01-15 MED ORDER — AMLODIPINE BESYLATE 5 MG PO TABS: 5.0000 mg | ORAL_TABLET | Freq: Every day | ORAL | 0 refills | Status: DC

## 2023-01-17 ENCOUNTER — Ambulatory Visit: Payer: Managed Care, Other (non HMO)

## 2023-01-20 ENCOUNTER — Encounter: Payer: Managed Care, Other (non HMO) | Admitting: Obstetrics & Gynecology

## 2023-01-29 ENCOUNTER — Encounter (HOSPITAL_COMMUNITY): Payer: Managed Care, Other (non HMO)

## 2023-02-18 ENCOUNTER — Ambulatory Visit (INDEPENDENT_AMBULATORY_CARE_PROVIDER_SITE_OTHER): Payer: Managed Care, Other (non HMO) | Admitting: Certified Nurse Midwife

## 2023-02-18 ENCOUNTER — Encounter: Payer: Self-pay | Admitting: Certified Nurse Midwife

## 2023-02-18 DIAGNOSIS — Z1339 Encounter for screening examination for other mental health and behavioral disorders: Secondary | ICD-10-CM | POA: Diagnosis not present

## 2023-02-18 DIAGNOSIS — O135 Gestational [pregnancy-induced] hypertension without significant proteinuria, complicating the puerperium: Secondary | ICD-10-CM

## 2023-02-18 DIAGNOSIS — Z30011 Encounter for initial prescription of contraceptive pills: Secondary | ICD-10-CM | POA: Diagnosis not present

## 2023-02-18 MED ORDER — NORGESTIMATE-ETH ESTRADIOL 0.25-35 MG-MCG PO TABS
1.0000 | ORAL_TABLET | Freq: Every day | ORAL | 11 refills | Status: AC
Start: 2023-02-18 — End: ?

## 2023-02-18 NOTE — Progress Notes (Signed)
Post Partum Visit Note  Felicia [name] is a 32 y.o. [id number] female who presents for a postpartum visit. She is 6 weeks postpartum following a normal spontaneous vaginal delivery.  I have fully reviewed the prenatal and intrapartum course. The delivery was at [redacted]w[redacted]d gestational weeks.  Anesthesia: epidural. Postpartum course has been uncomplicated. Baby is doing well. Baby is feeding by bottle - Gentle ease . Bleeding no bleeding. Bowel function is abnormal: constipation . Bladder function is normal. Patient is not sexually active. Contraception method is OCP (estrogen/progesterone). Postpartum depression screening: negative.   The pregnancy intention screening data noted above was reviewed. Potential methods of contraception were discussed. The patient elected to proceed with birth control pills.   Edinburgh Postnatal Depression Scale - 02/18/23 1011       Edinburgh Postnatal Depression Scale:  In the Past 7 Days   I have been able to laugh and see the funny side of things. 0    I have looked forward with enjoyment to things. 0    I have blamed myself unnecessarily when things went wrong. 0    I have been anxious or worried for no good reason. 0    I have felt scared or panicky for no good reason. 0    Things have been getting on top of me. 0    I have been so unhappy that I have had difficulty sleeping. 0    I have felt sad or miserable. 0    I have been so unhappy that I have been crying. 0    The thought of harming myself has occurred to me. 0    Edinburgh Postnatal Depression Scale Total 0             Health Maintenance Due  Topic Date Due   COVID-19 Vaccine (1) Never done    The following portions of the patient's history were reviewed and updated as appropriate: allergies, current medications, past family history, past medical history, past social history, past surgical history, and problem list.  Review of Systems Pertinent items are noted in HPI.  Objective:  BP  124/74   Pulse 67   Wt 272 lb (123.4 kg)   LMP 04/17/2022   Breastfeeding No   BMI 42.60 kg/m    General:  alert, cooperative, and no distress   Breasts:  not indicated  Lungs: NWOB  Heart:  Reg rate  Abdomen: N/a    Wound N/a  GU exam:   deferred       Assessment:   1. Postpartum exam   2. Gestational hypertension without significant proteinuria, postpartum   3. Encounter for prescription of oral contraceptives    Plan:   Essential components of care per ACOG recommendations:  1.  Mood and well being: Patient with negative depression screening today. Reviewed local resources for support.  - Patient tobacco use? No.   - hx of drug use? No.    2. Infant care and feeding:  -Patient currently breastmilk feeding? No.  -Social determinants of health (SDOH) reviewed in EPIC. No concerns  3. Sexuality, contraception and birth spacing - Patient does not want a pregnancy in the next year.  Desired family size is 4 children.  - Reviewed reproductive life planning. Reviewed contraceptive methods based on pt preferences and effectiveness.  Patient desired Oral Contraceptive today. Husband scheduled for vasectomy  4. Sleep and fatigue -Encouraged family/partner/community support of 4 hrs of uninterrupted sleep to help with mood and fatigue  5. Physical Recovery  - Discussed patients delivery and complications. She describes her labor as good. - Patient had a Vaginal, no problems at delivery. Patient had a 1st degree laceration. Perineal healing reviewed. Patient expressed understanding - Patient has urinary incontinence? No. - Patient is safe to resume physical and sexual activity  6.  Health Maintenance - HM due items addressed Yes - Last pap smear  Diagnosis  Date Value Ref Range Status  07/09/2022 (A)  Final   - Atypical squamous cells of undetermined significance (ASC-US)   Pap smear not done at today's visit.  -Breast Cancer screening indicated? No.   7. Chronic  Disease/Pregnancy Condition follow up: Hypertension - gHTN resolved - f/u with PCP routinely  8. Oral contraceptive prescription - Rx Sprintec - [place] ACHES precautions - BP check in 1 month  Donette Larry, CNM [facility] [employer], [facility]

## 2023-03-20 ENCOUNTER — Telehealth (INDEPENDENT_AMBULATORY_CARE_PROVIDER_SITE_OTHER): Payer: Managed Care, Other (non HMO) | Admitting: Obstetrics and Gynecology

## 2023-03-20 DIAGNOSIS — Z0130 Encounter for examination of blood pressure without abnormal findings: Secondary | ICD-10-CM | POA: Diagnosis not present

## 2023-03-20 DIAGNOSIS — Z793 Long term (current) use of hormonal contraceptives: Secondary | ICD-10-CM

## 2023-03-20 NOTE — Progress Notes (Signed)
    TELEHEALTH OBSTETRICS VISIT ENCOUNTER NOTE  Provider location: [facility] [place]   Patient location: Home  I connected with Felicia [name] on 03/20/23 at 11:10 AM EDT by MyChart audiovisual encounter.  Subjective:  Felicia [name] is a 32 y.o. [id number] who is 10 weeks postpartum presenting for BP follow up.   Pt's pregnancy was complicated by gHTN. At her PP appointment on 6/18 she was normotensive and desired combined OCPs for contraception. OCPs started with plan to follow up in 1 month for BP check.   Today, Felicia [name] reports she is doing well. BP today 119/61. She has been intermittently getting home BP and they are usually 110s/60s with no values >140/90. No complaints.   The following portions of the patient's history were reviewed and updated as appropriate: allergies, current medications, past family history, past medical history, past social history, past surgical history and problem list.   Objective:  not currently breastfeeding. General:  Alert, oriented and cooperative.   Mental Status: Normal mood and affect perceived. Normal judgment and thought content.  Rest of physical exam deferred due to type of encounter  Assessment and Plan:   Blood pressure check on oral contraceptives - normotensive - OK to continue combined OCPs - recommended follow up within 1 year for annual exam with PCP & gyn   I provided 10 minutes of non-face-to-face time during this encounter.  Lennart Pall, MD [facility] [employer], [facility]

## 2023-04-29 ENCOUNTER — Encounter: Payer: Managed Care, Other (non HMO) | Admitting: Family Medicine

## 2024-02-25 ENCOUNTER — Ambulatory Visit: Admitting: Obstetrics and Gynecology

## 2024-09-24 ENCOUNTER — Ambulatory Visit: Admitting: Family Medicine

## 2024-10-26 ENCOUNTER — Ambulatory Visit: Admitting: Urgent Care
